# Patient Record
Sex: Male | Born: 1960 | Race: White | Hispanic: No | Marital: Married | Smoking: Never smoker
Health system: Southern US, Community
[De-identification: ages and names within clinical notes are randomized; demographics above are authoritative.]

## PROBLEM LIST (undated history)

## (undated) DIAGNOSIS — H18511 Endothelial corneal dystrophy, right eye: Secondary | ICD-10-CM

## (undated) DIAGNOSIS — N486 Induration penis plastica: Secondary | ICD-10-CM

## (undated) DIAGNOSIS — R918 Other nonspecific abnormal finding of lung field: Secondary | ICD-10-CM

## (undated) DIAGNOSIS — I700 Atherosclerosis of aorta: Secondary | ICD-10-CM

## (undated) DIAGNOSIS — I77819 Aortic ectasia, unspecified site: Secondary | ICD-10-CM

## (undated) DIAGNOSIS — F419 Anxiety disorder, unspecified: Secondary | ICD-10-CM

## (undated) DIAGNOSIS — M1990 Unspecified osteoarthritis, unspecified site: Secondary | ICD-10-CM

## (undated) DIAGNOSIS — J432 Centrilobular emphysema: Secondary | ICD-10-CM

## (undated) HISTORY — PX: WISDOM TOOTH EXTRACTION: SHX21

---

## 2009-11-29 ENCOUNTER — Emergency Department (HOSPITAL_BASED_OUTPATIENT_CLINIC_OR_DEPARTMENT_OTHER): Admission: EM | Admit: 2009-11-29 | Discharge: 2009-11-29 | Payer: Self-pay | Admitting: Emergency Medicine

## 2010-05-27 LAB — POCT CARDIAC MARKERS
CKMB, poc: 1.1 ng/mL (ref 1.0–8.0)
Myoglobin, poc: 55.5 ng/mL (ref 12–200)
Troponin i, poc: <0.05 ng/mL (ref 0.00–0.09)

## 2010-05-27 LAB — COMPREHENSIVE METABOLIC PANEL
ALT: 27 U/L (ref 0–53)
Calcium: 9.6 mg/dL (ref 8.4–10.5)
Creatinine, Ser: 0.8 mg/dL (ref 0.4–1.5)
GFR calc Af Amer: >60 mL/min (ref >60)
Glucose, Bld: 120 mg/dL — ABNORMAL HIGH (ref 70–99)
Sodium: 142 mEq/L (ref 135–145)
Total Protein: 7.3 g/dL (ref 6.0–8.3)

## 2010-05-27 LAB — CBC
HCT: 47.3 % (ref 39.0–52.0)
MCHC: 35.4 g/dL (ref 30.0–36.0)
RDW: 11.6 % (ref 11.5–15.5)

## 2010-05-27 LAB — URINALYSIS, ROUTINE W REFLEX MICROSCOPIC
Bilirubin Urine: NEGATIVE
Ketones, ur: NEGATIVE mg/dL
Nitrite: NEGATIVE
Protein, ur: NEGATIVE mg/dL
Urobilinogen, UA: 0.2 mg/dL (ref 0.0–1.0)
pH: 7 (ref 5.0–8.0)

## 2010-05-27 LAB — DIFFERENTIAL
Eosinophils Absolute: 0.1 10*3/uL (ref 0.0–0.7)
Lymphocytes Relative: 8 % — ABNORMAL LOW (ref 12–46)
Lymphs Abs: 0.7 10*3/uL (ref 0.7–4.0)
Monocytes Relative: 5 % (ref 3–12)
Neutrophils Relative %: 87 % — ABNORMAL HIGH (ref 43–77)

## 2017-02-07 ENCOUNTER — Other Ambulatory Visit: Payer: Self-pay | Admitting: Orthopaedic Surgery

## 2017-02-07 DIAGNOSIS — M25512 Pain in left shoulder: Secondary | ICD-10-CM

## 2017-02-15 ENCOUNTER — Ambulatory Visit
Admission: RE | Admit: 2017-02-15 | Discharge: 2017-02-15 | Disposition: A | Discharge status: Home / Self Care | Payer: No Typology Code available for payment source | Source: Ambulatory Visit | Attending: Orthopaedic Surgery | Admitting: Orthopaedic Surgery

## 2017-02-15 DIAGNOSIS — M25512 Pain in left shoulder: Secondary | ICD-10-CM

## 2017-02-24 ENCOUNTER — Encounter (HOSPITAL_BASED_OUTPATIENT_CLINIC_OR_DEPARTMENT_OTHER): Payer: Self-pay | Admitting: *Deleted

## 2017-02-28 NOTE — H&P (Signed)
PREOPERATIVE H&P  Chief Complaint: INPINGEMENT SYNDROME, STRAIN OF MUSCLE AND TENDON OF LEFT ROATOR CUFF OF LEFT SHOULDER  HPI: Normajean GlasgowSteven Hershman is a 56 y.o. male who presents for preoperative history and physical with a diagnosis of INPINGEMENT SYNDROME, STRAIN OF MUSCLE AND TENDON OF LEFT ROATOR CUFF OF LEFT SHOULDER. Symptoms are rated as moderate to severe, and have been worsening.  This is significantly impairing activities of daily living.  He has elected for surgical management.   Past Medical History:  Diagnosis Date  . Arthritis    Past Surgical History:  Procedure Laterality Date  . WISDOM TOOTH EXTRACTION     Social History   Socioeconomic History  . Marital status: Married    Spouse name: None  . Number of children: None  . Years of education: None  . Highest education level: None  Social Needs  . Financial resource strain: None  . Food insecurity - worry: None  . Food insecurity - inability: None  . Transportation needs - medical: None  . Transportation needs - non-medical: None  Occupational History  . None  Tobacco Use  . Smoking status: Never Smoker  . Smokeless tobacco: Never Used  Substance and Sexual Activity  . Alcohol use: No    Frequency: Never  . Drug use: No  . Sexual activity: None  Other Topics Concern  . None  Social History Narrative  . None   History reviewed. No pertinent family history. No Known Allergies Prior to Admission medications   Medication Sig Start Date End Date Taking? Authorizing Provider  meloxicam (MOBIC) 15 MG tablet Take 15 mg by mouth daily.   Yes [provider]  Omega-3 Fatty Acids (FISH OIL) 1000 MG CAPS Take by mouth.   Yes [provider]     Positive ROS: All other systems have been reviewed and were otherwise negative with the exception of those mentioned in the HPI and as above.  Physical Exam: General: Alert, no acute distress Cardiovascular: No pedal edema Respiratory: No cyanosis, no  use of accessory musculature GI: No organomegaly, abdomen is soft and non-tender Skin: No lesions in the area of chief complaint Neurologic: Sensation intact distally Psychiatric: Patient is competent for consent with normal mood and affect Lymphatic: No axillary or cervical lymphadenopathy  MUSCULOSKELETAL:L shoulder: positive drop arm test, 3/5 cuff strength, distal motor and sensory preserved Assessment: INPINGEMENT SYNDROME, STRAIN OF MUSCLE AND TENDON OF LEFT ROATOR CUFF OF LEFT SHOULDER  Plan: Plan for Procedure(s): IRRIGATION AND DEBRIDEMENT SCOPE LEFT SHOULDER LEFT SHOULDER ACROMIOPLASTY LEFT SHOULDER ACROMIOPLASTY WITH ROTATOR CUFF REPAIR AND OPEN BICEPS TENODESIS, SUBSCAPULARIS  The risks benefits and alternatives were discussed with the patient including but not limited to the risks of nonoperative treatment, versus surgical intervention including infection, bleeding, nerve injury,  blood clots, cardiopulmonary complications, morbidity, mortality, among others, and they were willing to proceed.   Bjorn Pippinax T Joncarlo Friberg, MD  02/28/2017 12:21 PM

## 2017-03-02 ENCOUNTER — Other Ambulatory Visit: Payer: Self-pay

## 2017-03-02 ENCOUNTER — Encounter (HOSPITAL_BASED_OUTPATIENT_CLINIC_OR_DEPARTMENT_OTHER): Payer: Self-pay

## 2017-03-02 ENCOUNTER — Ambulatory Visit (HOSPITAL_BASED_OUTPATIENT_CLINIC_OR_DEPARTMENT_OTHER): Payer: Self-pay | Admitting: Certified Registered"

## 2017-03-02 ENCOUNTER — Ambulatory Visit (HOSPITAL_BASED_OUTPATIENT_CLINIC_OR_DEPARTMENT_OTHER)
Admission: RE | Admit: 2017-03-02 | Discharge: 2017-03-02 | Disposition: A | Discharge status: Home / Self Care | Payer: Self-pay | Source: Ambulatory Visit | Attending: Orthopaedic Surgery | Admitting: Orthopaedic Surgery

## 2017-03-02 ENCOUNTER — Encounter (HOSPITAL_BASED_OUTPATIENT_CLINIC_OR_DEPARTMENT_OTHER)
Admission: RE | Disposition: A | Discharge status: Home / Self Care | Payer: Self-pay | Source: Ambulatory Visit | Attending: Orthopaedic Surgery

## 2017-03-02 DIAGNOSIS — M25312 Other instability, left shoulder: Secondary | ICD-10-CM | POA: Insufficient documentation

## 2017-03-02 DIAGNOSIS — S46012A Strain of muscle(s) and tendon(s) of the rotator cuff of left shoulder, initial encounter: Secondary | ICD-10-CM | POA: Insufficient documentation

## 2017-03-02 DIAGNOSIS — W19XXXA Unspecified fall, initial encounter: Secondary | ICD-10-CM | POA: Insufficient documentation

## 2017-03-02 DIAGNOSIS — Y929 Unspecified place or not applicable: Secondary | ICD-10-CM | POA: Insufficient documentation

## 2017-03-02 DIAGNOSIS — M7542 Impingement syndrome of left shoulder: Secondary | ICD-10-CM | POA: Insufficient documentation

## 2017-03-02 HISTORY — PX: IRRIGATION AND DEBRIDEMENT SHOULDER: SHX5880

## 2017-03-02 HISTORY — DX: Unspecified osteoarthritis, unspecified site: M19.90

## 2017-03-02 HISTORY — PX: SHOULDER ACROMIOPLASTY: SHX6093

## 2017-03-02 HISTORY — PX: SHOULDER ARTHROSCOPY WITH SUBACROMIAL DECOMPRESSION: SHX5684

## 2017-03-02 SURGERY — IRRIGATION AND DEBRIDEMENT SHOULDER
Anesthesia: Regional | Site: Shoulder | Laterality: Left

## 2017-03-02 MED ORDER — FENTANYL CITRATE (PF) 100 MCG/2ML IJ SOLN
INTRAMUSCULAR | Status: AC
  Filled 2017-03-02: qty 2

## 2017-03-02 MED ORDER — PHENYLEPHRINE 40 MCG/ML (10ML) SYRINGE FOR IV PUSH (FOR BLOOD PRESSURE SUPPORT)
PREFILLED_SYRINGE | INTRAVENOUS | Status: AC
  Filled 2017-03-02: qty 10

## 2017-03-02 MED ORDER — ROCURONIUM BROMIDE 10 MG/ML (PF) SYRINGE
PREFILLED_SYRINGE | INTRAVENOUS | Status: AC
  Filled 2017-03-02: qty 5

## 2017-03-02 MED ORDER — LIDOCAINE 2% (20 MG/ML) 5 ML SYRINGE
INTRAMUSCULAR | Status: DC | PRN
  Administered 2017-03-02: 60 mg via INTRAVENOUS

## 2017-03-02 MED ORDER — MELOXICAM 7.5 MG PO TABS: 7.5000 mg | ORAL_TABLET | Freq: Every day | ORAL | 2 refills | Status: AC

## 2017-03-02 MED ORDER — MIDAZOLAM HCL 2 MG/2ML IJ SOLN
INTRAMUSCULAR | Status: DC | PRN
  Administered 2017-03-02: 1 mg via INTRAVENOUS

## 2017-03-02 MED ORDER — LIDOCAINE 2% (20 MG/ML) 5 ML SYRINGE
INTRAMUSCULAR | Status: AC
  Filled 2017-03-02: qty 10

## 2017-03-02 MED ORDER — ONDANSETRON HCL 4 MG/2ML IJ SOLN: 4.0000 mg | Freq: Once | INTRAMUSCULAR | Status: DC | PRN

## 2017-03-02 MED ORDER — ROCURONIUM BROMIDE 10 MG/ML (PF) SYRINGE
PREFILLED_SYRINGE | INTRAVENOUS | Status: DC | PRN
  Administered 2017-03-02: 50 mg via INTRAVENOUS

## 2017-03-02 MED ORDER — MIDAZOLAM HCL 2 MG/2ML IJ SOLN
1.0000 mg | INTRAMUSCULAR | Status: DC | PRN
  Administered 2017-03-02: 2 mg via INTRAVENOUS

## 2017-03-02 MED ORDER — ROPIVACAINE HCL 7.5 MG/ML IJ SOLN
INTRAMUSCULAR | Status: DC | PRN
  Administered 2017-03-02: 20 mL via PERINEURAL

## 2017-03-02 MED ORDER — VANCOMYCIN HCL 1000 MG IV SOLR
INTRAVENOUS | Status: AC
  Filled 2017-03-02: qty 1000

## 2017-03-02 MED ORDER — FENTANYL CITRATE (PF) 100 MCG/2ML IJ SOLN: 50.0000 ug | INTRAMUSCULAR | Status: DC | PRN

## 2017-03-02 MED ORDER — ONDANSETRON HCL 4 MG PO TABS: 4.0000 mg | ORAL_TABLET | Freq: Three times a day (TID) | ORAL | 0 refills | Status: DC | PRN

## 2017-03-02 MED ORDER — DEXAMETHASONE SODIUM PHOSPHATE 10 MG/ML IJ SOLN
INTRAMUSCULAR | Status: AC
  Filled 2017-03-02: qty 2

## 2017-03-02 MED ORDER — ACETAMINOPHEN 500 MG PO TABS: ORAL_TABLET | ORAL | 2 refills | Status: DC

## 2017-03-02 MED ORDER — LACTATED RINGERS IV SOLN
INTRAVENOUS | Status: DC
  Administered 2017-03-02 (×3): via INTRAVENOUS

## 2017-03-02 MED ORDER — PROPOFOL 10 MG/ML IV BOLUS
INTRAVENOUS | Status: DC | PRN
  Administered 2017-03-02: 150 mg via INTRAVENOUS
  Administered 2017-03-02: 50 mg via INTRAVENOUS

## 2017-03-02 MED ORDER — SODIUM CHLORIDE 0.9 % IR SOLN
Status: DC | PRN
  Administered 2017-03-02: 6000 mL

## 2017-03-02 MED ORDER — DEXAMETHASONE SODIUM PHOSPHATE 10 MG/ML IJ SOLN
INTRAMUSCULAR | Status: DC | PRN
  Administered 2017-03-02: 10 mg via INTRAVENOUS

## 2017-03-02 MED ORDER — SUGAMMADEX SODIUM 200 MG/2ML IV SOLN
INTRAVENOUS | Status: DC | PRN
  Administered 2017-03-02: 300 mg via INTRAVENOUS

## 2017-03-02 MED ORDER — FENTANYL CITRATE (PF) 100 MCG/2ML IJ SOLN: 25.0000 ug | INTRAMUSCULAR | Status: DC | PRN

## 2017-03-02 MED ORDER — CHLORHEXIDINE GLUCONATE 4 % EX LIQD: 60.0000 mL | Freq: Once | CUTANEOUS | Status: DC

## 2017-03-02 MED ORDER — CEFAZOLIN SODIUM-DEXTROSE 2-4 GM/100ML-% IV SOLN
INTRAVENOUS | Status: AC
  Filled 2017-03-02: qty 100

## 2017-03-02 MED ORDER — ONDANSETRON HCL 4 MG/2ML IJ SOLN
INTRAMUSCULAR | Status: AC
  Filled 2017-03-02: qty 4

## 2017-03-02 MED ORDER — CEFAZOLIN SODIUM-DEXTROSE 2-4 GM/100ML-% IV SOLN
2.0000 g | INTRAVENOUS | Status: AC
  Administered 2017-03-02: 2 g via INTRAVENOUS

## 2017-03-02 MED ORDER — FENTANYL CITRATE (PF) 100 MCG/2ML IJ SOLN
INTRAMUSCULAR | Status: DC | PRN
  Administered 2017-03-02 (×2): 50 ug via INTRAVENOUS

## 2017-03-02 MED ORDER — PHENYLEPHRINE HCL 10 MG/ML IJ SOLN
INTRAVENOUS | Status: DC | PRN
  Administered 2017-03-02: 25 ug/min via INTRAVENOUS

## 2017-03-02 MED ORDER — MIDAZOLAM HCL 2 MG/2ML IJ SOLN
INTRAMUSCULAR | Status: AC
  Filled 2017-03-02: qty 2

## 2017-03-02 MED ORDER — ONDANSETRON HCL 4 MG/2ML IJ SOLN
INTRAMUSCULAR | Status: DC | PRN
  Administered 2017-03-02: 4 mg via INTRAVENOUS

## 2017-03-02 MED ORDER — SCOPOLAMINE 1 MG/3DAYS TD PT72: 1.0000 | MEDICATED_PATCH | Freq: Once | TRANSDERMAL | Status: DC | PRN

## 2017-03-02 MED ORDER — PROPOFOL 10 MG/ML IV BOLUS
INTRAVENOUS | Status: AC
  Filled 2017-03-02: qty 20

## 2017-03-02 MED ORDER — OXYCODONE HCL 5 MG PO TABS: ORAL_TABLET | ORAL | 0 refills | Status: DC

## 2017-03-02 SURGICAL SUPPLY — 70 items
BLADE SHAVER BONE 5.0X13 (MISCELLANEOUS) IMPLANT
BLADE SURG 10 STRL SS (BLADE) ×4 IMPLANT
BNDG COHESIVE 4X5 TAN STRL (GAUZE/BANDAGES/DRESSINGS) IMPLANT
BURR OVAL 8 FLU 4.0MM X 13CM (MISCELLANEOUS) ×1
BURR OVAL 8 FLU 4.0X13 (MISCELLANEOUS) ×3 IMPLANT
CANNULA 5.75X71 LONG (CANNULA) ×4 IMPLANT
CANNULA PASSPORT BUTTON 10-40 (CANNULA) IMPLANT
CANNULA TWIST IN 8.25X7CM (CANNULA) IMPLANT
CHLORAPREP W/TINT 26ML (MISCELLANEOUS) ×4 IMPLANT
CLOSURE WOUND 1/2 X4 (GAUZE/BANDAGES/DRESSINGS)
DECANTER SPIKE VIAL GLASS SM (MISCELLANEOUS) IMPLANT
DISSECTOR 3.5MM X 13CM CVD (MISCELLANEOUS) IMPLANT
DISSECTOR 4.0MMX13CM CVD (MISCELLANEOUS) IMPLANT
DRAPE IMP U-DRAPE 54X76 (DRAPES) ×4 IMPLANT
DRAPE INCISE IOBAN 66X45 STRL (DRAPES) IMPLANT
DRAPE STERI 35X30 U-POUCH (DRAPES) ×4 IMPLANT
DRAPE U-SHAPE 76X120 STRL (DRAPES) ×4 IMPLANT
DRSG PAD ABDOMINAL 8X10 ST (GAUZE/BANDAGES/DRESSINGS) ×4 IMPLANT
ELECT NEEDLE TIP 2.8 STRL (NEEDLE) IMPLANT
ELECT REM PT RETURN 9FT ADLT (ELECTROSURGICAL) ×4
ELECTRODE REM PT RTRN 9FT ADLT (ELECTROSURGICAL) ×2 IMPLANT
GAUZE SPONGE 4X4 12PLY STRL (GAUZE/BANDAGES/DRESSINGS) ×4 IMPLANT
GAUZE XEROFORM 1X8 LF (GAUZE/BANDAGES/DRESSINGS) IMPLANT
GLOVE BIOGEL PI IND STRL 7.0 (GLOVE) ×2 IMPLANT
GLOVE BIOGEL PI IND STRL 8 (GLOVE) ×4 IMPLANT
GLOVE BIOGEL PI INDICATOR 7.0 (GLOVE) ×2
GLOVE BIOGEL PI INDICATOR 8 (GLOVE) ×4
GLOVE ECLIPSE 6.5 STRL STRAW (GLOVE) ×8 IMPLANT
GLOVE ECLIPSE 7.0 STRL STRAW (GLOVE) ×4 IMPLANT
GLOVE ECLIPSE 8.0 STRL XLNG CF (GLOVE) ×4 IMPLANT
GOWN STRL REUS W/ TWL LRG LVL3 (GOWN DISPOSABLE) ×4 IMPLANT
GOWN STRL REUS W/TWL LRG LVL3 (GOWN DISPOSABLE) ×4
GOWN STRL REUS W/TWL XL LVL3 (GOWN DISPOSABLE) ×4 IMPLANT
KIT PUSHLOCK 2.9 HIP (KITS) IMPLANT
KIT STABILIZATION SHOULDER (MISCELLANEOUS) ×4 IMPLANT
LASSO 90 CVE QUICKPAS (DISPOSABLE) ×4 IMPLANT
MANIFOLD NEPTUNE II (INSTRUMENTS) ×4 IMPLANT
NDL SUT 6 .5 CRC .975X.05 MAYO (NEEDLE) IMPLANT
NEEDLE MAYO TAPER (NEEDLE)
NEEDLE SCORPION MULTI FIRE (NEEDLE) ×8 IMPLANT
PACK ARTHROSCOPY DSU (CUSTOM PROCEDURE TRAY) ×4 IMPLANT
PACK BASIN DAY SURGERY FS (CUSTOM PROCEDURE TRAY) ×4 IMPLANT
PENCIL BUTTON HOLSTER BLD 10FT (ELECTRODE) IMPLANT
PROBE BIPOLAR ATHRO 135MM 90D (MISCELLANEOUS) ×12 IMPLANT
RESTRAINT HEAD UNIVERSAL NS (MISCELLANEOUS) ×4 IMPLANT
SHEET MEDIUM DRAPE 40X70 STRL (DRAPES) IMPLANT
SLEEVE SCD COMPRESS KNEE MED (MISCELLANEOUS) ×4 IMPLANT
SLING ARM FOAM STRAP LRG (SOFTGOODS) IMPLANT
SLING ARM IMMOBILIZER LRG (SOFTGOODS) IMPLANT
SLING ARM IMMOBILIZER MED (SOFTGOODS) IMPLANT
SLING ARM MED ADULT FOAM STRAP (SOFTGOODS) IMPLANT
SLING ARM XL FOAM STRAP (SOFTGOODS) IMPLANT
SPONGE LAP 4X18 X RAY DECT (DISPOSABLE) IMPLANT
STRIP CLOSURE SKIN 1/2X4 (GAUZE/BANDAGES/DRESSINGS) IMPLANT
SUCTION FRAZIER HANDLE 10FR (MISCELLANEOUS)
SUCTION TUBE FRAZIER 10FR DISP (MISCELLANEOUS) IMPLANT
SUT ETHILON 3 0 PS 1 (SUTURE) IMPLANT
SUT FIBERWIRE #2 38 T-5 BLUE (SUTURE)
SUT MON AB 2-0 CT1 36 (SUTURE) ×4 IMPLANT
SUT TIGER TAPE 7 IN WHITE (SUTURE) IMPLANT
SUTURE FIBERWR #2 38 T-5 BLUE (SUTURE) IMPLANT
SUTURE TAPE TIGERLINK 1.3MM BL (SUTURE) IMPLANT
SUTURETAPE TIGERLINK 1.3MM BL (SUTURE)
TAPE FIBER 2MM 7IN #2 BLUE (SUTURE) IMPLANT
TOWEL OR 17X24 6PK STRL BLUE (TOWEL DISPOSABLE) ×4 IMPLANT
TOWEL OR NON WOVEN STRL DISP B (DISPOSABLE) ×4 IMPLANT
TUBE CONNECTING 20'X1/4 (TUBING) ×1
TUBE CONNECTING 20X1/4 (TUBING) ×3 IMPLANT
TUBING ARTHROSCOPY IRRIG 16FT (MISCELLANEOUS) ×4 IMPLANT
YANKAUER SUCT BULB TIP NO VENT (SUCTIONS) IMPLANT

## 2017-03-02 NOTE — Anesthesia Preprocedure Evaluation (Addendum)
Anesthesia Evaluation  Patient identified by MRN, date of birth, ID band Patient awake    Reviewed: Allergy & Precautions, NPO status , Patient's Chart, lab work & pertinent test results  Airway Mallampati: II  TM Distance: >3 FB Neck ROM: Full    Dental  (+) Partial Upper, Poor Dentition, Missing, Chipped, Dental Advisory Given   Pulmonary neg pulmonary ROS,    Pulmonary exam normal breath sounds clear to auscultation       Cardiovascular negative cardio ROS Normal cardiovascular exam Rhythm:Regular Rate:Normal     Neuro/Psych negative neurological ROS  negative psych ROS   GI/Hepatic negative GI ROS, Neg liver ROS,   Endo/Other  negative endocrine ROS  Renal/GU negative Renal ROS     Musculoskeletal INPINGEMENT SYNDROME STRAIN OF MUSCLE AND TENDON OF LEFT ROATOR CUFF OF LEFT SHOULDER   Abdominal   Peds  Hematology negative hematology ROS (+)   Anesthesia Other Findings   Reproductive/Obstetrics                           Anesthesia Physical Anesthesia Plan  ASA: II  Anesthesia Plan: General and Regional   Post-op Pain Management: GA combined w/ Regional for post-op pain   Induction: Intravenous  PONV Risk Score and Plan: 2 and Ondansetron, Dexamethasone and Treatment may vary due to age or medical condition  Airway Management Planned: Oral ETT  Additional Equipment:   Intra-op Plan:   Post-operative Plan: Extubation in OR  Informed Consent: I have reviewed the patients History and Physical, chart, labs and discussed the procedure including the risks, benefits and alternatives for the proposed anesthesia with the patient or authorized representative who has indicated his/her understanding and acceptance.   Dental advisory given  Plan Discussed with: CRNA  Anesthesia Plan Comments:        Anesthesia Quick Evaluation

## 2017-03-02 NOTE — Discharge Instructions (Signed)
Shouder arthroscopy, partial rotator cuff tear debridement subacromial decompression °Care After Instructions °Refer to this sheet in the next few weeks. These discharge instructions provide you with general information on caring for yourself after you leave the hospital. Your caregiver may also give you specific instructions. Your treatment has been planned according to the most current medical practices available, but unavoidable complications sometimes occur. If you have any problems or questions after discharge, please call your caregiver. °HOME INSTRUCTIONS °You may resume a normal diet and activities as directed. Take showers instead of baths until informed otherwise.  °Change bandages (dressings) in 3 days.  Swab wounds daily with betadine.  Wash shoulder with soap and water.  Pat dry.  Cover wounds with bandaids. °Only take over-the-counter or prescription medicines for pain, discomfort, or fever as directed by your caregiver.  °Wear your sling for the next 2 days unless otherwise instructed. °Eat a well-balanced diet.  °Avoid lifting or driving until you are instructed otherwise.  °Make an appointment to see your caregiver for stitches (suture) or staple removal one week after surgery. ° °SEEK MEDICAL CARE IF: °You have swelling of your calf or leg.  °You develop shortness of breath or chest pain.  °You have redness, swelling, or increasing pain in the wound.  °There is pus or any unusual drainage coming from the surgical site.  °You notice a bad smell coming from the surgical site or dressing.  °The surgical site breaks open after sutures or staples have been removed.  °There is persistent bleeding from the suture or staple line.  °You are getting worse or are not improving.  °You have any other questions or concerns.  °SEEK IMMEDIATE MEDICAL CARE IF:  °You have a fever greater than 101 °You develop a rash.  °You have difficulty breathing.  °You develop any reaction or side effects to medicines given.    °Your knee motion is decreasing rather than improving.  °MAKE SURE YOU:  °Understand these instructions.  °Will watch your condition.  °Will get help right away if you are not doing well or get worse.  ° ° ° °Post Anesthesia Home Care Instructions ° °Activity: °Get plenty of rest for the remainder of the day. A responsible individual must stay with you for 24 hours following the procedure.  °For the next 24 hours, DO NOT: °-Drive a car °-Operate machinery °-Drink alcoholic beverages °-Take any medication unless instructed by your physician °-Make any legal decisions or sign important papers. ° °Meals: °Start with liquid foods such as gelatin or soup. Progress to regular foods as tolerated. Avoid greasy, spicy, heavy foods. If nausea and/or vomiting occur, drink only clear liquids until the nausea and/or vomiting subsides. Call your physician if vomiting continues. ° °Special Instructions/Symptoms: °Your throat may feel dry or sore from the anesthesia or the breathing tube placed in your throat during surgery. If this causes discomfort, gargle with warm salt water. The discomfort should disappear within 24 hours. ° °If you had a scopolamine patch placed behind your ear for the management of post- operative nausea and/or vomiting: ° °1. The medication in the patch is effective for 72 hours, after which it should be removed.  Wrap patch in a tissue and discard in the trash. Wash hands thoroughly with soap and water. °2. You may remove the patch earlier than 72 hours if you experience unpleasant side effects which may include dry mouth, dizziness or visual disturbances. °3. Avoid touching the patch. Wash your hands with soap and water after   contact with the patch. °  ° ° °Regional Anesthesia Blocks ° °1. Numbness or the inability to move the "blocked" extremity may last from 3-48 hours after placement. The length of time depends on the medication injected and your individual response to the medication. If the numbness  is not going away after 48 hours, call your surgeon. ° °2. The extremity that is blocked will need to be protected until the numbness is gone and the  Strength has returned. Because you cannot feel it, you will need to take extra care to avoid injury. Because it may be weak, you may have difficulty moving it or using it. You may not know what position it is in without looking at it while the block is in effect. ° °3. For blocks in the legs and feet, returning to weight bearing and walking needs to be done carefully. You will need to wait until the numbness is entirely gone and the strength has returned. You should be able to move your leg and foot normally before you try and bear weight or walk. You will need someone to be with you when you first try to ensure you do not fall and possibly risk injury. ° °4. Bruising and tenderness at the needle site are common side effects and will resolve in a few days. ° °5. Persistent numbness or new problems with movement should be communicated to the surgeon or the [facility] ([phone])/ [facility] ([phone]). °

## 2017-03-02 NOTE — Progress Notes (Signed)
AssistedDr. Ellender with left, ultrasound guided, interscalene  block. Side rails up, monitors on throughout procedure. See vital signs in flow sheet. Tolerated Procedure well.  

## 2017-03-02 NOTE — Anesthesia Procedure Notes (Signed)
Procedure Name: Intubation Date/Time: 03/02/2017 10:54 AM Performed by: Minerva EndsMirarchi, Jabron Weese M, CRNA Pre-anesthesia Checklist: Patient identified, Emergency Drugs available, Suction available and Patient being monitored Patient Re-evaluated:Patient Re-evaluated prior to induction Oxygen Delivery Method: Circle System Utilized Preoxygenation: Pre-oxygenation with 100% oxygen Induction Type: IV induction Ventilation: Mask ventilation without difficulty Laryngoscope Size: Miller and 2 Grade View: Grade I Tube type: Oral Tube size: 7.0 mm Number of attempts: 1 Airway Equipment and Method: Stylet and Oral airway Placement Confirmation: ETT inserted through vocal cords under direct vision,  positive ETCO2 and breath sounds checked- equal and bilateral Secured at: 21 cm Tube secured with: Tape Dental Injury: Teeth and Oropharynx as per pre-operative assessment  Comments: Smooth IV induction Ellender--- intubation AM CRNA atraumatic--- teeth and mouth as preop--- many broken and chipped teeth--- teeth and mouth unchanged after intubation--- bilat BS Ellender

## 2017-03-02 NOTE — Op Note (Signed)
Orthopaedic Surgery Operative Note (CSN: [id number])  Edward GlasgowSteven Horton  11/29/1960 Date of Surgery: 03/02/2017   Diagnoses:  Left shoulder dislocation, massive rotator cuff repair, medialization of biceps, os acromiale  Procedure:  Left shoulder arthroscopy, extensive debridement [id number] Biceps tenotomy Subacromial decompression 29826  Operative Finding Operative plan was for a repair of a massive rotator cuff tear that was thought to be a mildly acute on chronic picture with a retracted tear.  Upon inspection of the joint there is clear sign of chronic acetabular sedation of the acromion as well as severe retraction and atrophy of the muscle of the cuff.  We long discussion with the patient's wife and son on how to proceed with options including extensive debridement, subacromial decompression and biceps tenotomy versus an attempt at a rotator cuff repair..  The family felt that less invasive options and the plan for eventually in a reverse total shoulder arthroplasty if he was not happy was more appropriate as he can get back to work sooner.Marland Kitchen.  Post-operative plan: The patient will be weightbearing as tolerated after 2 days in a sling.  The patient will be discharged home.  DVT prophylaxis not indicated and upper extremity surgery in a healthy patient.  Pain control with PRN pain medication preferring oral medicines.  Follow up plan will be scheduled in approximately 10-14 days for wound check.  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  Subscapularis, infraspinatus, supraspinatus torn.  Biceps medialized. Surgeons:Primary: Bjorn PippinVarkey, Rilie Glanz T, MD Assistants:Lindsey Dub MikesStanbery Location: [facility] OR ROOM 6 Anesthesia: Choice Antibiotics: Ancef 2g preop Tourniquet time: * No tourniquets in log * Estimated Blood Loss: 10mL Complications: None Specimens: None Implants: * No implants in log *  Indications for Surgery:   Edward GlasgowSteven Horton is a 56 y.o.  male with fall resulting in a dislocation of his left shoulder and reported mild pain prior to surgery but no major problems with his shoulder.  We began.  We will plan for a rotator cuff repair.  And biceps tenodesis with the understanding that a massive rotator cuff repair had a chance of failure of 50%.  Benefits and risks of operative and nonoperative management were discussed prior to surgery with patient/guardian(s) and informed consent form was completed.  Specific risks including infection, need for additional surgery, rotator cuff.  Repair failure, stiffness.     Procedure:   The patient was identified in the preoperative holding area where the surgical site was marked. The patient was taken to the OR where a procedural timeout was called and the above noted anesthesia was induced.  The patient was positioned beachchair on an allen table, spider positioner.  Preoperative antibiotics were dosed.  The patient's left shoulder was prepped and draped in the usual sterile fashion.  A second preoperative timeout was called.      Exam under anesthesia: no obvious anterior posterior instability, head seemed to elevate during FE attempts Articular space: Grade 1-2 changes head and glenoid.  Obvious attritional loss of anterior inferior labrum.  Chondral surfaces:Intact, no sign of chondral degeneration on the glenoid or humeral head Biceps: medialized in setting of known subscapularis tear with Grade 2 Slap lesion Subscapularis: near complete tear, some remaining tissue inferiorly. Superior Cuff: retracted non-mobile tears of the supraspinatus and infraspinatus tendons, intact teres minor Bursal side: Os acromiale noted, mobile at baseline, still similarly mobile post SAD  A standard posterior viewing portal was made after localizing the portal with a spinal needle.  An anterior accessory  portal was also made.  After clearing the articular space the camera was positioned in the subacromial space.   Findings above.  We performed an extensive debridement of the articular and bursal surfaces and noted the above findings.  Biceps tenotomy performed.   Subacromial decompression: We made a lateral portal with spinal needle guidance. We then proceeded to debride bursal tissue extensively with a shaver and arthrocare device. At that point we continued to identify the borders of the acromion and identify the spur. We then carefully preserved the deltoid fascia and used a burr to convert the type 2 acromion to a Type 1 flat acromion without issue even in the setting of an os acromial.  After an extensive discussion with the family the decision was made that an attempted rotator cuff repair which would likely fail based on the tissue quality and retraction was determined to not be in the patient's best interest.  We instead elected with a limited goal surgery and preservation of his tissue planes for possibility of an eventual reverse total shoulder arthroplasty.  Portals were closed in a interrupted fashion with observable sutures and sterile dressing placed prior to waking the patient from anesthesia.

## 2017-03-02 NOTE — Interval H&P Note (Signed)
History and Physical Interval Note:  03/02/2017 9:53 AM  Edward Horton  has presented today for surgery, with the diagnosis of INPINGEMENT SYNDROME, STRAIN OF MUSCLE AND TENDON OF LEFT ROATOR CUFF OF LEFT SHOULDER  The various methods of treatment have been discussed with the patient and family. After consideration of risks, benefits and other options for treatment, the patient has consented to  Procedure(s): IRRIGATION AND DEBRIDEMENT SCOPE LEFT SHOULDER (Left) LEFT SHOULDER ACROMIOPLASTY (Left) LEFT SHOULDER ACROMIOPLASTY WITH ROTATOR CUFF REPAIR AND OPEN BICEPS TENODESIS, SUBSCAPULARIS (Left) as a surgical intervention .  The patient's history has been reviewed, patient examined, no change in status, stable for surgery.  I have reviewed the patient's chart and labs.  Questions were answered to the patient's satisfaction.     Bjorn Pippinax T Devin Foskey

## 2017-03-02 NOTE — Anesthesia Procedure Notes (Signed)
Anesthesia Regional Block: Interscalene brachial plexus block   Pre-Anesthetic Checklist: ,, timeout performed, Correct Patient, Correct Site, Correct Laterality, Correct Procedure,, site marked, risks and benefits discussed, Surgical consent,  Pre-op evaluation,  At surgeon's request and post-op pain management  Laterality: Left  Prep: chloraprep       Needles:  Injection technique: Single-shot  Needle Type: Echogenic Stimulator Needle     Needle Length: 9cm  Needle Gauge: 21     Additional Needles:   Procedures:,,,, ultrasound used (permanent image in chart),,,,  Narrative:  Start time: 03/02/2017 9:30 AM End time: 03/02/2017 9:40 AM Injection made incrementally with aspirations every 5 mL.  Performed by: Personally  Anesthesiologist: Leonides GrillsEllender, Ryan P, MD  Additional Notes: Functioning IV was confirmed and monitors were applied.  A 90mm 21ga Arrow echogenic stimulator needle was used. Sterile prep, hand hygiene and sterile gloves were used.  Negative aspiration and negative test dose prior to incremental administration of local anesthetic. The patient tolerated the procedure well.

## 2017-03-02 NOTE — Anesthesia Procedure Notes (Signed)
Date/Time: 03/02/2017 12:23 PM Performed by: Minerva EndsMirarchi, Aylin Rhoads M, CRNA Oxygen Delivery Method: Simple face mask Placement Confirmation: breath sounds checked- equal and bilateral and positive ETCO2 Dental Injury: Teeth and Oropharynx as per pre-operative assessment  Comments: Extubated to face mask

## 2017-03-02 NOTE — Transfer of Care (Signed)
Immediate Anesthesia Transfer of Care Note  Patient: Edward Horton  Procedure(s) Performed: IRRIGATION AND DEBRIDEMENT SCOPE LEFT SHOULDER (Left ) LEFT SHOULDER ACROMIOPLASTY (Left Shoulder) SHOULDER ARTHROSCOPY WITH SUBACROMIAL DECOMPRESSION (Left Shoulder)  Patient Location: PACU  Anesthesia Type:General  Level of Consciousness: sedated  Airway & Oxygen Therapy: Patient Spontanous Breathing and Patient connected to face mask oxygen  Post-op Assessment: Report given to RN and Post -op Vital signs reviewed and stable  Post vital signs: Reviewed and stable  Last Vitals:  Vitals:   03/02/17 1000 03/02/17 1239  BP: (!) 134/92 132/88  Pulse: 89 93  Resp: 18 18  Temp:  (P) 36.4 C  SpO2: 95% 98%    Last Pain:  Vitals:   03/02/17 0841  TempSrc: Oral  PainSc: 1       Patients Stated Pain Goal: 1 (03/02/17 0841)  Complications: No apparent anesthesia complications

## 2017-03-03 ENCOUNTER — Encounter (HOSPITAL_BASED_OUTPATIENT_CLINIC_OR_DEPARTMENT_OTHER): Payer: Self-pay | Admitting: Orthopaedic Surgery

## 2017-03-03 NOTE — Anesthesia Postprocedure Evaluation (Signed)
Anesthesia Post Note  Patient: Normajean GlasgowSteven Christoffel  Procedure(s) Performed: IRRIGATION AND DEBRIDEMENT SCOPE LEFT SHOULDER (Left ) LEFT SHOULDER ACROMIOPLASTY (Left Shoulder) SHOULDER ARTHROSCOPY WITH SUBACROMIAL DECOMPRESSION (Left Shoulder)     Patient location during evaluation: PACU Anesthesia Type: Regional and General Level of consciousness: awake and alert Pain management: pain level controlled Vital Signs Assessment: post-procedure vital signs reviewed and stable Respiratory status: spontaneous breathing, nonlabored ventilation, respiratory function stable and patient connected to nasal cannula oxygen Cardiovascular status: blood pressure returned to baseline and stable Postop Assessment: no apparent nausea or vomiting Anesthetic complications: no    Last Vitals:  Vitals:   03/02/17 1330 03/02/17 1405  BP: 128/82 138/87  Pulse: 86 88  Resp: 17 18  Temp:  36.8 C  SpO2: 94% 95%    Last Pain:  Vitals:   03/03/17 0934  TempSrc:   PainSc: 3                  Britzy Graul P Abdulkadir Emmanuel

## 2017-04-11 NOTE — H&P (Signed)
PREOPERATIVE H&P  Chief Complaint: LEFT SHOULDER OSTEOARTRITIS  HPI: Edward GlasgowSteven Hofferber is a 57 y.o. male who presents for preoperative history and physical with a diagnosis of LEFT SHOULDER OSTEOARTRITIS. Symptoms are rated as moderate to severe, and have been worsening.  This is significantly impairing activities of daily living.  He has elected for surgical management.   Past Medical History:  Diagnosis Date  . Arthritis    Past Surgical History:  Procedure Laterality Date  . IRRIGATION AND DEBRIDEMENT SHOULDER Left 03/02/2017   Procedure: IRRIGATION AND DEBRIDEMENT SCOPE LEFT SHOULDER;  Surgeon: Bjorn PippinVarkey, Arietta Eisenstein T, MD;  Location: [facility];  Service: Orthopedics;  Laterality: Left;  . SHOULDER ACROMIOPLASTY Left 03/02/2017   Procedure: LEFT SHOULDER ACROMIOPLASTY;  Surgeon: Bjorn PippinVarkey, Milanni Ayub T, MD;  Location: [facility];  Service: Orthopedics;  Laterality: Left;  . SHOULDER ARTHROSCOPY WITH SUBACROMIAL DECOMPRESSION Left 03/02/2017   Procedure: SHOULDER ARTHROSCOPY WITH SUBACROMIAL DECOMPRESSION;  Surgeon: Bjorn PippinVarkey, Khasir Woodrome T, MD;  Location:  SURGERY CENTER;  Service: Orthopedics;  Laterality: Left;  . WISDOM TOOTH EXTRACTION     Social History   Socioeconomic History  . Marital status: Married    Spouse name: Not on file  . Number of children: Not on file  . Years of education: Not on file  . Highest education level: Not on file  Social Needs  . Financial resource strain: Not on file  . Food insecurity - worry: Not on file  . Food insecurity - inability: Not on file  . Transportation needs - medical: Not on file  . Transportation needs - non-medical: Not on file  Occupational History  . Not on file  Tobacco Use  . Smoking status: Never Smoker  . Smokeless tobacco: Never Used  Substance and Sexual Activity  . Alcohol use: No    Frequency: Never  . Drug use: No  . Sexual activity: Not on file  Other Topics Concern  . Not on file  Social History  Narrative  . Not on file   No family history on file. No Known Allergies Prior to Admission medications   Medication Sig Start Date End Date Taking? Authorizing Provider  acetaminophen (TYLENOL) 500 MG tablet Take 2 tabs po q8 hours x 14 days following surgery 03/02/17   Cristie HemStanbery, Mary L, PA-C  ondansetron (ZOFRAN) 4 MG tablet Take 1 tablet (4 mg total) by mouth every 8 (eight) hours as needed for nausea or vomiting. 03/02/17   Cristie HemStanbery, Mary L, PA-C  oxyCODONE (ROXICODONE) 5 MG immediate release tablet Take 1-2 tabs po q6-8 hours prn pain.  Do not exceed 6 tabs per day 03/02/17   Cristie HemStanbery, Mary L, PA-C     Positive ROS: All other systems have been reviewed and were otherwise negative with the exception of those mentioned in the HPI and as above.  Physical Exam: General: Alert, no acute distress Cardiovascular: No pedal edema Respiratory: No cyanosis, no use of accessory musculature GI: No organomegaly, abdomen is soft and non-tender Skin: No lesions in the area of chief complaint Neurologic: Sensation intact distally Psychiatric: Patient is competent for consent with normal mood and affect Lymphatic: No axillary or cervical lymphadenopathy  MUSCULOSKELETAL: L shoulder: AFE 30, passive 160, cuff 3/5  Assessment: LEFT SHOULDER OSTEOARTRITIS  Plan: Plan for Procedure(s): LEFT REVERSE  TOTAL SHOULDER ARTHROPLASTY  The risks benefits and alternatives were discussed with the patient including but not limited to the risks of nonoperative treatment, versus surgical intervention including infection, bleeding, nerve injury,  blood clots, cardiopulmonary complications, morbidity, mortality, among others, and they were willing to proceed.   Bjorn Pippin, MD  04/11/2017 10:16 AM

## 2017-04-13 ENCOUNTER — Encounter (HOSPITAL_BASED_OUTPATIENT_CLINIC_OR_DEPARTMENT_OTHER): Payer: Self-pay | Admitting: *Deleted

## 2017-04-14 ENCOUNTER — Encounter (HOSPITAL_BASED_OUTPATIENT_CLINIC_OR_DEPARTMENT_OTHER)
Admission: RE | Admit: 2017-04-14 | Discharge: 2017-04-14 | Disposition: A | Discharge status: Home / Self Care | Payer: Self-pay | Source: Ambulatory Visit | Attending: Orthopaedic Surgery | Admitting: Orthopaedic Surgery

## 2017-04-14 DIAGNOSIS — Z01812 Encounter for preprocedural laboratory examination: Secondary | ICD-10-CM | POA: Insufficient documentation

## 2017-04-14 LAB — SURGICAL PCR SCREEN
MRSA, PCR: NEGATIVE
STAPHYLOCOCCUS AUREUS: NEGATIVE

## 2017-05-04 ENCOUNTER — Ambulatory Visit (HOSPITAL_BASED_OUTPATIENT_CLINIC_OR_DEPARTMENT_OTHER): Payer: Self-pay | Admitting: Anesthesiology

## 2017-05-04 ENCOUNTER — Encounter (HOSPITAL_BASED_OUTPATIENT_CLINIC_OR_DEPARTMENT_OTHER): Payer: Self-pay | Admitting: Anesthesiology

## 2017-05-04 ENCOUNTER — Other Ambulatory Visit: Payer: Self-pay

## 2017-05-04 ENCOUNTER — Ambulatory Visit (HOSPITAL_COMMUNITY): Payer: Self-pay

## 2017-05-04 ENCOUNTER — Ambulatory Visit (HOSPITAL_BASED_OUTPATIENT_CLINIC_OR_DEPARTMENT_OTHER)
Admission: RE | Admit: 2017-05-04 | Discharge: 2017-05-04 | Disposition: A | Discharge status: Home / Self Care | Payer: Self-pay | Source: Ambulatory Visit | Attending: Orthopaedic Surgery | Admitting: Orthopaedic Surgery

## 2017-05-04 ENCOUNTER — Encounter (HOSPITAL_BASED_OUTPATIENT_CLINIC_OR_DEPARTMENT_OTHER)
Admission: RE | Disposition: A | Discharge status: Home / Self Care | Payer: Self-pay | Source: Ambulatory Visit | Attending: Orthopaedic Surgery

## 2017-05-04 DIAGNOSIS — M19012 Primary osteoarthritis, left shoulder: Secondary | ICD-10-CM | POA: Insufficient documentation

## 2017-05-04 DIAGNOSIS — Z09 Encounter for follow-up examination after completed treatment for conditions other than malignant neoplasm: Secondary | ICD-10-CM

## 2017-05-04 DIAGNOSIS — M75102 Unspecified rotator cuff tear or rupture of left shoulder, not specified as traumatic: Secondary | ICD-10-CM | POA: Insufficient documentation

## 2017-05-04 DIAGNOSIS — Z79899 Other long term (current) drug therapy: Secondary | ICD-10-CM | POA: Insufficient documentation

## 2017-05-04 HISTORY — PX: REVERSE SHOULDER ARTHROPLASTY: SHX5054

## 2017-05-04 SURGERY — ARTHROPLASTY, SHOULDER, TOTAL, REVERSE
Anesthesia: General | Site: Shoulder | Laterality: Left

## 2017-05-04 MED ORDER — CEFAZOLIN SODIUM-DEXTROSE 1-4 GM/50ML-% IV SOLN
1.0000 g | Freq: Once | INTRAVENOUS | Status: AC
  Administered 2017-05-04: 1 g via INTRAVENOUS

## 2017-05-04 MED ORDER — OXYCODONE HCL 5 MG PO TABS
5.0000 mg | ORAL_TABLET | Freq: Once | ORAL | Status: AC | PRN
  Administered 2017-05-04: 5 mg via ORAL

## 2017-05-04 MED ORDER — BUPIVACAINE HCL (PF) 0.25 % IJ SOLN
INTRAMUSCULAR | Status: AC
  Filled 2017-05-04: qty 30

## 2017-05-04 MED ORDER — LIDOCAINE 2% (20 MG/ML) 5 ML SYRINGE
INTRAMUSCULAR | Status: AC
Start: 2017-05-04 — End: 2017-05-04
  Filled 2017-05-04: qty 5

## 2017-05-04 MED ORDER — CEFAZOLIN SODIUM-DEXTROSE 2-4 GM/100ML-% IV SOLN
INTRAVENOUS | Status: AC
  Filled 2017-05-04: qty 100

## 2017-05-04 MED ORDER — CEFAZOLIN SODIUM-DEXTROSE 1-4 GM/50ML-% IV SOLN
INTRAVENOUS | Status: AC
  Filled 2017-05-04: qty 50

## 2017-05-04 MED ORDER — LACTATED RINGERS IV SOLN
INTRAVENOUS | Status: DC
  Administered 2017-05-04: 08:00:00 via INTRAVENOUS

## 2017-05-04 MED ORDER — METOCLOPRAMIDE HCL 5 MG/ML IJ SOLN: 10.0000 mg | Freq: Once | INTRAMUSCULAR | Status: DC | PRN

## 2017-05-04 MED ORDER — ROCURONIUM BROMIDE 10 MG/ML (PF) SYRINGE
PREFILLED_SYRINGE | INTRAVENOUS | Status: AC
  Filled 2017-05-04: qty 5

## 2017-05-04 MED ORDER — VANCOMYCIN HCL 1000 MG IV SOLR
INTRAVENOUS | Status: AC
  Filled 2017-05-04: qty 1000

## 2017-05-04 MED ORDER — OXYCODONE HCL 5 MG PO TABS
ORAL_TABLET | ORAL | Status: AC
  Filled 2017-05-04: qty 1

## 2017-05-04 MED ORDER — FENTANYL CITRATE (PF) 100 MCG/2ML IJ SOLN
25.0000 ug | INTRAMUSCULAR | Status: DC | PRN
  Administered 2017-05-04 (×2): 50 ug via INTRAVENOUS

## 2017-05-04 MED ORDER — SCOPOLAMINE 1 MG/3DAYS TD PT72: 1.0000 | MEDICATED_PATCH | Freq: Once | TRANSDERMAL | Status: DC | PRN

## 2017-05-04 MED ORDER — PROPOFOL 10 MG/ML IV BOLUS
INTRAVENOUS | Status: DC | PRN
  Administered 2017-05-04: 200 mg via INTRAVENOUS

## 2017-05-04 MED ORDER — LIDOCAINE HCL (CARDIAC) 20 MG/ML IV SOLN
INTRAVENOUS | Status: DC | PRN
  Administered 2017-05-04: 50 mg via INTRAVENOUS

## 2017-05-04 MED ORDER — LACTATED RINGERS IV SOLN
INTRAVENOUS | Status: DC
  Administered 2017-05-04: 13:00:00 via INTRAVENOUS

## 2017-05-04 MED ORDER — FENTANYL CITRATE (PF) 100 MCG/2ML IJ SOLN
INTRAMUSCULAR | Status: AC
  Filled 2017-05-04: qty 2

## 2017-05-04 MED ORDER — OMEPRAZOLE 20 MG PO CPDR: 20.0000 mg | DELAYED_RELEASE_CAPSULE | Freq: Every day | ORAL | 0 refills | Status: DC

## 2017-05-04 MED ORDER — OXYCODONE HCL 5 MG PO TABS: ORAL_TABLET | ORAL | 0 refills | Status: AC

## 2017-05-04 MED ORDER — CEFAZOLIN SODIUM 1 G IJ SOLR: 1.0000 g | Freq: Once | INTRAMUSCULAR | Status: DC

## 2017-05-04 MED ORDER — ONDANSETRON HCL 4 MG/2ML IJ SOLN
INTRAMUSCULAR | Status: DC | PRN
  Administered 2017-05-04: 4 mg via INTRAVENOUS

## 2017-05-04 MED ORDER — ONDANSETRON HCL 4 MG/2ML IJ SOLN
INTRAMUSCULAR | Status: AC
  Filled 2017-05-04: qty 2

## 2017-05-04 MED ORDER — DEXAMETHASONE SODIUM PHOSPHATE 4 MG/ML IJ SOLN
INTRAMUSCULAR | Status: DC | PRN
  Administered 2017-05-04: 10 mg via INTRAVENOUS

## 2017-05-04 MED ORDER — EPHEDRINE SULFATE 50 MG/ML IJ SOLN
INTRAMUSCULAR | Status: DC | PRN
  Administered 2017-05-04: 20 mg via INTRAVENOUS

## 2017-05-04 MED ORDER — SUGAMMADEX SODIUM 200 MG/2ML IV SOLN
INTRAVENOUS | Status: AC
  Filled 2017-05-04: qty 2

## 2017-05-04 MED ORDER — MEPERIDINE HCL 25 MG/ML IJ SOLN: 6.2500 mg | INTRAMUSCULAR | Status: DC | PRN

## 2017-05-04 MED ORDER — SUGAMMADEX SODIUM 200 MG/2ML IV SOLN
INTRAVENOUS | Status: DC | PRN
  Administered 2017-05-04: 200 mg via INTRAVENOUS

## 2017-05-04 MED ORDER — EPINEPHRINE 30 MG/30ML IJ SOLN
INTRAMUSCULAR | Status: AC
  Filled 2017-05-04: qty 1

## 2017-05-04 MED ORDER — DEXAMETHASONE SODIUM PHOSPHATE 10 MG/ML IJ SOLN
INTRAMUSCULAR | Status: AC
  Filled 2017-05-04: qty 1

## 2017-05-04 MED ORDER — ACETAMINOPHEN 500 MG PO TABS: 1000.0000 mg | ORAL_TABLET | Freq: Three times a day (TID) | ORAL | 0 refills | Status: AC

## 2017-05-04 MED ORDER — MELOXICAM 7.5 MG PO TABS: 7.5000 mg | ORAL_TABLET | Freq: Every day | ORAL | 2 refills | Status: AC

## 2017-05-04 MED ORDER — EPHEDRINE 5 MG/ML INJ
INTRAVENOUS | Status: AC
  Filled 2017-05-04: qty 10

## 2017-05-04 MED ORDER — CHLORHEXIDINE GLUCONATE 4 % EX LIQD: 60.0000 mL | Freq: Once | CUTANEOUS | Status: DC

## 2017-05-04 MED ORDER — TRANEXAMIC ACID 1000 MG/10ML IV SOLN
1000.0000 mg | INTRAVENOUS | Status: AC
  Administered 2017-05-04: 1000 mg via INTRAVENOUS
  Filled 2017-05-04: qty 10

## 2017-05-04 MED ORDER — FENTANYL CITRATE (PF) 100 MCG/2ML IJ SOLN
50.0000 ug | INTRAMUSCULAR | Status: DC | PRN
  Administered 2017-05-04: 100 ug via INTRAVENOUS

## 2017-05-04 MED ORDER — MIDAZOLAM HCL 2 MG/2ML IJ SOLN
1.0000 mg | INTRAMUSCULAR | Status: DC | PRN
  Administered 2017-05-04: 2 mg via INTRAVENOUS

## 2017-05-04 MED ORDER — ROCURONIUM BROMIDE 100 MG/10ML IV SOLN
INTRAVENOUS | Status: DC | PRN
  Administered 2017-05-04: 50 mg via INTRAVENOUS
  Administered 2017-05-04: 20 mg via INTRAVENOUS
  Administered 2017-05-04 (×2): 10 mg via INTRAVENOUS

## 2017-05-04 MED ORDER — FENTANYL CITRATE (PF) 100 MCG/2ML IJ SOLN
INTRAMUSCULAR | Status: AC
Start: 2017-05-04 — End: 2017-05-04
  Filled 2017-05-04: qty 2

## 2017-05-04 MED ORDER — MIDAZOLAM HCL 2 MG/2ML IJ SOLN
INTRAMUSCULAR | Status: AC
  Filled 2017-05-04: qty 2

## 2017-05-04 MED ORDER — ROPIVACAINE HCL 5 MG/ML IJ SOLN
INTRAMUSCULAR | Status: DC | PRN
  Administered 2017-05-04: 30 mL via EPIDURAL

## 2017-05-04 MED ORDER — CEFAZOLIN SODIUM-DEXTROSE 2-4 GM/100ML-% IV SOLN
2.0000 g | INTRAVENOUS | Status: AC
  Administered 2017-05-04: 2 g via INTRAVENOUS

## 2017-05-04 MED ORDER — ONDANSETRON HCL 4 MG PO TABS: 4.0000 mg | ORAL_TABLET | Freq: Three times a day (TID) | ORAL | 1 refills | Status: AC | PRN

## 2017-05-04 MED ORDER — SODIUM CHLORIDE 0.9 % IR SOLN
Status: DC | PRN
Start: 2017-05-04 — End: 2017-05-04
  Administered 2017-05-04: 500 mL
  Administered 2017-05-04: 1000 mL

## 2017-05-04 SURGICAL SUPPLY — 73 items
2.5MM X 200MM PIN ×3 IMPLANT
3.2MM DRILL BIT ×3 IMPLANT
BASEPLATE GLENOSPHERE 25 STD (Miscellaneous) ×2 IMPLANT
BASEPLATE GLENOSPHERE 25MM STD (Miscellaneous) ×1 IMPLANT
BENZOIN TINCTURE PRP APPL 2/3 (GAUZE/BANDAGES/DRESSINGS) ×3 IMPLANT
BLADE HEX COATED 2.75 (ELECTRODE) ×3 IMPLANT
BLADE SAGITTAL 25.0X1.27X90 (BLADE) ×2 IMPLANT
BLADE SAGITTAL 25.0X1.27X90MM (BLADE) ×1
BLADE SURG 10 STRL SS (BLADE) ×3 IMPLANT
BLADE SURG 15 STRL LF DISP TIS (BLADE) ×2 IMPLANT
BLADE SURG 15 STRL SS (BLADE) ×4
BNDG COHESIVE 4X5 TAN STRL (GAUZE/BANDAGES/DRESSINGS) ×3 IMPLANT
CAP SHOULDER REVTOTAL 2 ×3 IMPLANT
CHLORAPREP W/TINT 26ML (MISCELLANEOUS) ×3 IMPLANT
CLOSURE WOUND 1/2 X4 (GAUZE/BANDAGES/DRESSINGS) ×1
COVER BACK TABLE 60X90IN (DRAPES) ×3 IMPLANT
DECANTER SPIKE VIAL GLASS SM (MISCELLANEOUS) IMPLANT
DRAPE IMP U-DRAPE 54X76 (DRAPES) ×3 IMPLANT
DRAPE INCISE IOBAN 66X45 STRL (DRAPES) ×6 IMPLANT
DRAPE U-SHAPE 76X120 STRL (DRAPES) ×6 IMPLANT
DRSG AQUACEL AG ADV 3.5X 6 (GAUZE/BANDAGES/DRESSINGS) ×3 IMPLANT
DRSG AQUACEL AG ADV 3.5X10 (GAUZE/BANDAGES/DRESSINGS) ×3 IMPLANT
DRSG MEPILEX BORDER 4X8 (GAUZE/BANDAGES/DRESSINGS) IMPLANT
ELECT BLADE 4.0 EZ CLEAN MEGAD (MISCELLANEOUS) ×3
ELECT REM PT RETURN 9FT ADLT (ELECTROSURGICAL) ×3
ELECTRODE BLDE 4.0 EZ CLN MEGD (MISCELLANEOUS) ×1 IMPLANT
ELECTRODE REM PT RTRN 9FT ADLT (ELECTROSURGICAL) ×1 IMPLANT
GAUZE XEROFORM 1X8 LF (GAUZE/BANDAGES/DRESSINGS) IMPLANT
GLOVE BIO SURGEON STRL SZ 6.5 (GLOVE) ×2 IMPLANT
GLOVE BIO SURGEON STRL SZ8 (GLOVE) IMPLANT
GLOVE BIO SURGEONS STRL SZ 6.5 (GLOVE) ×1
GLOVE BIOGEL PI IND STRL 7.0 (GLOVE) ×3 IMPLANT
GLOVE BIOGEL PI IND STRL 8 (GLOVE) ×1 IMPLANT
GLOVE BIOGEL PI INDICATOR 7.0 (GLOVE) ×6
GLOVE BIOGEL PI INDICATOR 8 (GLOVE) ×2
GLOVE ECLIPSE 6.5 STRL STRAW (GLOVE) ×6 IMPLANT
GLOVE ECLIPSE 8.0 STRL XLNG CF (GLOVE) ×6 IMPLANT
GOWN STRL REUS W/ TWL LRG LVL3 (GOWN DISPOSABLE) ×2 IMPLANT
GOWN STRL REUS W/TWL LRG LVL3 (GOWN DISPOSABLE) ×4
GOWN STRL REUS W/TWL XL LVL3 (GOWN DISPOSABLE) ×3 IMPLANT
HANDPIECE INTERPULSE COAX TIP (DISPOSABLE) ×2
KIT STABILIZATION SHOULDER (MISCELLANEOUS) ×3 IMPLANT
MANIFOLD NEPTUNE II (INSTRUMENTS) ×3 IMPLANT
NS IRRIG 1000ML POUR BTL (IV SOLUTION) ×3 IMPLANT
PACK ARTHROSCOPY DSU (CUSTOM PROCEDURE TRAY) ×3 IMPLANT
PACK BASIN DAY SURGERY FS (CUSTOM PROCEDURE TRAY) ×3 IMPLANT
PAD ORTHO SHOULDER 7X19 LRG (SOFTGOODS) ×3 IMPLANT
PENCIL BUTTON HOLSTER BLD 10FT (ELECTRODE) ×3 IMPLANT
RESTRAINT HEAD UNIVERSAL NS (MISCELLANEOUS) ×3 IMPLANT
SCREW BONE 6.5X40 SM (Screw) ×3 IMPLANT
SET HNDPC FAN SPRY TIP SCT (DISPOSABLE) ×1 IMPLANT
SHEET MEDIUM DRAPE 40X70 STRL (DRAPES) ×3 IMPLANT
SLEEVE SCD COMPRESS KNEE MED (MISCELLANEOUS) ×3 IMPLANT
SLING ARM FOAM STRAP LRG (SOFTGOODS) IMPLANT
SLING ARM MED ADULT FOAM STRAP (SOFTGOODS) IMPLANT
SLING ARM XL FOAM STRAP (SOFTGOODS) IMPLANT
SPONGE LAP 18X18 X RAY DECT (DISPOSABLE) ×6 IMPLANT
SPONGE LAP 4X18 X RAY DECT (DISPOSABLE) IMPLANT
STRIP CLOSURE SKIN 1/2X4 (GAUZE/BANDAGES/DRESSINGS) ×2 IMPLANT
SUT ETHIBOND 2 OS 4 DA (SUTURE) ×12 IMPLANT
SUT FIBERWIRE #5 38 CONV NDL (SUTURE) ×12
SUT MNCRL AB 3-0 PS2 18 (SUTURE) IMPLANT
SUT MNCRL AB 4-0 PS2 18 (SUTURE) ×3 IMPLANT
SUT MON AB 2-0 CT1 36 (SUTURE) IMPLANT
SUT VIC AB 0 CT1 18XCR BRD 8 (SUTURE) ×1 IMPLANT
SUT VIC AB 0 CT1 8-18 (SUTURE) ×2
SUT VIC AB 2-0 SH 27 (SUTURE) ×4
SUT VIC AB 2-0 SH 27XBRD (SUTURE) ×2 IMPLANT
SUTURE FIBERWR #5 38 CONV NDL (SUTURE) ×4 IMPLANT
SYR BULB 3OZ (MISCELLANEOUS) IMPLANT
TOWEL OR 17X24 6PK STRL BLUE (TOWEL DISPOSABLE) ×6 IMPLANT
TOWEL OR NON WOVEN STRL DISP B (DISPOSABLE) ×6 IMPLANT
TUBE SUCTION HIGH CAP CLEAR NV (SUCTIONS) ×3 IMPLANT

## 2017-05-04 NOTE — Op Note (Signed)
Orthopaedic Surgery Operative Note (CSN: [id number])  Anthem Edward Horton  03-Oct-1960 Date of Surgery: 05/04/2017   Diagnoses:  LEFT SHOULDER IRREPARABLE ROTATOR CUFF TEAR  Procedures:   * LEFT REVERSE  TOTAL SHOULDER ARTHROPLASTY 23472   Operative Finding Successful completion of planned procedure.  Good stability at end of case.  Subscap repair stable to 30 ER.  Scar around axillary nerve noted but tug test found to be normal at end of case.   Post-operative plan: The patient will be NWB in sling.  The patient will be kept in PACU for one additional dose of antibiotics and then sent home.  DVT prophylaxis not indicated in isolated upper extremity surgery patient with no specific risks factors.  Pain control with PRN pain medication preferring oral medicines.  Follow up plan will be scheduled in approximately 10-14 days for wound check and XR.  Post-Op Diagnosis: Same Surgeons:Primary: Hiram Gash, MD Assistants:None Location: [place] OR ROOM 6 Anesthesia: General Antibiotics: Ancef 2g preop Tourniquet time: * No tourniquets in log * Estimated Blood Loss: 465 Complications: None Specimens: None Implants: Implant Name Type Inv. Item Serial No. Manufacturer Lot No. LRB No. Used Action  Standard Baseplate   [id number] [employer] [id number] Left 1 Implanted  6.5 x 55m Central Screw    [employer] STERILIZED ON SET Left 1 Implanted  4.5 x 366mperipheral screw    [employer] STERILIZED ON SET Left 2 Implanted  Standard Glenosphere    [employer] [id number] 1 Implanted  Reversed Tray    [employer] [id number] 1 Implanted  Reversed Insert    [employer] [id number] 1 Implanted  Standard PTC Humeral Stem    [employer] [id number] 1 Implanted    Indications for Surgery:   Edward Horton a [age].o. 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  We did non-op treatment as well [facility] but ended up doing SAD,  tuberoplasty and biceps tenotomy with no relief.  Axillary nerve normal preop. Benefits and risks of operative and nonoperative management were discussed prior to surgery with patient/guardian(s) and informed consent form was completed.  Specific risks including infection, need for additional surgery, longevity of components, infection, periprosthetic fracture and dislocation   Procedure:   The patient was identified in the preoperative holding area where the surgical site was marked. The patient was taken to the OR where a procedural timeout was called and the above noted anesthesia was induced.  The patient was positioned beachchair on allen with spider.  Preoperative antibiotics were dosed.  The patient's left shoulder was prepped and draped in the usual sterile fashion.  A second preoperative timeout was called.      Standard deltopectoral approach was performed with a #10 blade. We dissected down to the subcutaneous tissues and the cephalic vein was taken laterally with the deltoid. Clavipectoral fascia was incised in line with the incision. Deep retractors were placed. The long of the biceps tendon was identified and there was significant tenosynovitis present.  Tenodesis was performed to the pectoralis tendon with #2 Ethibond. The remaining biceps was followed up into the rotator interval where it was released.   The subscapularis was torn off on inspection.  We mobilized and continued our dissection down in a full thickness layer with capsule along the humeral neck extending inferiorly around the humeral head. We continued releasing the capsule directly off of the osteophytes inferiorly all the way  around the corner. This allowed [place] to dislocate the humeral head.   The humeral head had evidence of mild osteoarthritic wear .   The rotator cuff was carefully examined and noted to be irreperably torn.  The decision was confirmed that a reverse total shoulder was indicated for this patient.  There  were osteophytes along the inferior humeral neck. The osteophytes were removed with an osteotome and a rongeur.  Osteophytes were removed with a rongeur and an osteotome and the anatomic neck was well visualized.     A humeral cutting guide was inserted down the intramedullary canal. The version was set at 20 of retroversion. Humeral osteotomy was performed with an oscillating saw. The head fragment was passed off the back table. A starter awl was used to open the humeral canal. We next used T-handle straight sound reamers to ream up to an appropriate fit. A chisel was used to remove proximal humeral bone. We then broached starting with a size one broach and broaching up to 2 which obtained an appropriate fit. The broach handle was removed. A cut protector was placed. The broach handle was removed and a cut protector was placed. The humerus was retracted posteriorly and we turned our attention to glenoid exposure.  The subscapularis was again identified and immediately we took care to palpate the axillary nerve anteriorly and verify its position with gentle palpation as well as the tug test.  We then released the SGHL with bovie cautery prior to placing a curved mayo at the junction of the anterior glenoid well above the axillary nerve and bluntly dissecting the subscapularis from the capsule.  We then carefully protected the axillary nerve as we gently released the inferior capsule to fully mobilize the subscapularis.  An anterior deltoid retractor was then placed as well as a small Hohmann retractor superiorly.   The glenoid was inspected and had minimal arthritis but some signs of proximal migration.  The glenoid drill guide was placed and used to drill a guide pin in the center, inferior position. The glenoid face was then reamed concentrically over the guide wire. The center hole was drilled over the guidepin in a near anatomic angle of version. Next the glenoid vault was drilled back to a depth of 35  mm.  We tapped and then placed a 35m size baseplate with0  lateralization was selected with a 6.5 mm x 35 mm length central screw.  The base plate was screwed into the glenoid vault obtaining secure fixation. We next placed superior and inferior locking screws for additional fixation.  Next a 39 mm glenosphere was selected and impacted onto the baseplate. The center screw was tightened.  We turned attention back to the humeral side. The cut protector was removed. We trialed with multiple size tray and polyethylene options and selected a 6 which provided good stability and range of motion without excess soft tissue tension. The offset was dialed in to match the normal anatomy. The shoulder was trialed.  There was good ROM in all planes and the shoulder was stable with no inferior translation.  The real humeral implants were opened after again confirming sizes.  The trial was removed. #5 Fiberwire sutures passed through the humeral neck for subscap repair. The humeral component was press-fit obtaining a secure fit. A +0 high offset tray was selected and impacted onto the stem.  A 39+6 polyethylene liner was impacted onto the stem.  The joint was reduced and thoroughly irrigated with pulsatile lavage. Subscap was repaired  back with #5 Fiberwire sutures through bone tunnels. Hemostasis was obtained. The deltopectoral interval was reapproximated with #1 Ethibond. The subcutaneous tissues were closed with 2-0 Vicryl and the skin was closed with running monocryl.    The wounds were cleaned and dried and an Aquacel dressing was placed. The drapes taken down. The arm was placed into sling with abduction pillow. Patient was awakened, extubated, and transferred to the recovery room in stable condition. There were no intraoperative complications. The sponge, needle, and attention counts were  correct at the end of the case.

## 2017-05-04 NOTE — Anesthesia Preprocedure Evaluation (Signed)
Anesthesia Evaluation  Patient identified by MRN, date of birth, ID band Patient awake    Reviewed: Allergy & Precautions, NPO status , Patient's Chart, lab work & pertinent test results  Airway Mallampati: II  TM Distance: >3 FB Neck ROM: Full    Dental no notable dental hx. (+) Poor Dentition, Missing   Pulmonary neg pulmonary ROS,    Pulmonary exam normal breath sounds clear to auscultation       Cardiovascular negative cardio ROS Normal cardiovascular exam Rhythm:Regular Rate:Normal     Neuro/Psych negative neurological ROS  negative psych ROS   GI/Hepatic negative GI ROS, Neg liver ROS,   Endo/Other  negative endocrine ROS  Renal/GU negative Renal ROS  negative genitourinary   Musculoskeletal negative musculoskeletal ROS (+)   Abdominal   Peds negative pediatric ROS (+)  Hematology negative hematology ROS (+)   Anesthesia Other Findings   Reproductive/Obstetrics negative OB ROS                             Anesthesia Physical Anesthesia Plan  ASA: II  Anesthesia Plan: General   Post-op Pain Management: GA combined w/ Regional for post-op pain   Induction: Intravenous  PONV Risk Score and Plan: 2 and Ondansetron, Treatment may vary due to age or medical condition and Midazolam  Airway Management Planned: Oral ETT  Additional Equipment:   Intra-op Plan:   Post-operative Plan: Extubation in OR  Informed Consent: I have reviewed the patients History and Physical, chart, labs and discussed the procedure including the risks, benefits and alternatives for the proposed anesthesia with the patient or authorized representative who has indicated his/her understanding and acceptance.   Dental advisory given  Plan Discussed with: CRNA  Anesthesia Plan Comments:         Anesthesia Quick Evaluation

## 2017-05-04 NOTE — Discharge Instructions (Signed)

## 2017-05-04 NOTE — Interval H&P Note (Signed)
Discussed case, risks and benefits with patient again.  All questions answered, no change to history.  Tatum Massman MD  

## 2017-05-04 NOTE — Anesthesia Procedure Notes (Signed)
Anesthesia Regional Block: Supraclavicular block   Pre-Anesthetic Checklist: ,, timeout performed, Correct Patient, Correct Site, Correct Laterality, Correct Procedure, Correct Position, site marked, Risks and benefits discussed,  Surgical consent,  Pre-op evaluation,  At surgeon's request and post-op pain management  Laterality: Left and Upper  Prep: Maximum Sterile Barrier Precautions used, chloraprep       Needles:  Injection technique: Single-shot  Needle Type: Echogenic Stimulator Needle     Needle Length: 10cm      Additional Needles:   Procedures:,,,, ultrasound used (permanent image in chart),,,,  Narrative:  Start time: 05/04/2017 8:40 AM End time: 05/04/2017 8:52 AM Injection made incrementally with aspirations every 5 mL.  Performed by: Personally  Anesthesiologist: Phillips Groutarignan, Demon Volante, MD  Additional Notes: Risks, benefits and alternative to block explained extensively.  Patient tolerated procedure well, without complications.

## 2017-05-04 NOTE — Transfer of Care (Signed)
Immediate Anesthesia Transfer of Care Note  Patient: Normajean GlasgowSteven Riechers  Procedure(s) Performed: LEFT REVERSE  TOTAL SHOULDER ARTHROPLASTY (Left Shoulder)  Patient Location: PACU  Anesthesia Type:General  Level of Consciousness: awake and sedated  Airway & Oxygen Therapy: Patient Spontanous Breathing and Patient connected to face mask oxygen  Post-op Assessment: Report given to RN and Post -op Vital signs reviewed and stable  Post vital signs: Reviewed and stable  Last Vitals:  Vitals:   05/04/17 0900 05/04/17 0905  BP: (!) 128/98 (!) 135/91  Pulse: 85 85  Resp: 15 13  Temp:    SpO2: 100% 96%    Last Pain:  Vitals:   05/04/17 0847  TempSrc:   PainSc: 0-No pain      Patients Stated Pain Goal: 3 (05/04/17 0841)  Complications: No apparent anesthesia complications

## 2017-05-04 NOTE — Anesthesia Procedure Notes (Signed)
Procedure Name: Intubation Performed by: York GricePearson, Gershom Brobeck W, CRNA Pre-anesthesia Checklist: Patient identified, Emergency Drugs available, Suction available and Patient being monitored Patient Re-evaluated:Patient Re-evaluated prior to induction Oxygen Delivery Method: Circle system utilized Preoxygenation: Pre-oxygenation with 100% oxygen Induction Type: IV induction Ventilation: Mask ventilation without difficulty Laryngoscope Size: Miller and 2 Grade View: Grade II Tube type: Oral Tube size: 7.0 mm Number of attempts: 1 Airway Equipment and Method: Stylet Placement Confirmation: ETT inserted through vocal cords under direct vision,  positive ETCO2 and breath sounds checked- equal and bilateral Secured at: 23 (23) cm Tube secured with: Tape Dental Injury: Teeth and Oropharynx as per pre-operative assessment

## 2017-05-04 NOTE — Progress Notes (Signed)
Assisted Dr. Carignan with left, ultrasound guided, supraclavicular block. Side rails up, monitors on throughout procedure. See vital signs in flow sheet. Tolerated Procedure well. 

## 2017-05-04 NOTE — Anesthesia Postprocedure Evaluation (Signed)
Anesthesia Post Note  Patient: Normajean GlasgowSteven Halberg  Procedure(s) Performed: LEFT REVERSE  TOTAL SHOULDER ARTHROPLASTY (Left Shoulder)     Patient location during evaluation: PACU Anesthesia Type: General Level of consciousness: awake and alert Pain management: pain level controlled Vital Signs Assessment: post-procedure vital signs reviewed and stable Respiratory status: spontaneous breathing, nonlabored ventilation, respiratory function stable and patient connected to nasal cannula oxygen Cardiovascular status: blood pressure returned to baseline and stable Postop Assessment: no apparent nausea or vomiting Anesthetic complications: no    Last Vitals:  Vitals:   05/04/17 1300 05/04/17 1315  BP: 116/77 114/81  Pulse: 84 87  Resp: 14 17  Temp:    SpO2: 96% 95%    Last Pain:  Vitals:   05/04/17 1315  TempSrc:   PainSc: 3                  Phillips Groutarignan, Leonard Feigel

## 2017-05-07 ENCOUNTER — Encounter (HOSPITAL_BASED_OUTPATIENT_CLINIC_OR_DEPARTMENT_OTHER): Payer: Self-pay | Admitting: Orthopaedic Surgery

## 2019-04-03 IMAGING — MR MR SHOULDER*L* W/O CM
5 series · 33 of 40 positions shown · non-contrast
Comparison: None.

CLINICAL DATA: Left shoulder pain since the patient suffered a trip
and fall in a parking lot 01/22/2017.

EXAM:
MRI OF THE LEFT SHOULDER WITHOUT CONTRAST
TECHNIQUE: Multiplanar, multisequence MR imaging of the shoulder was performed.
No intravenous contrast was administered.

[Series 4: T2 fat-sat · axial · 4.0mm · 0.55mm/px · z∈[-60,+35]mm · 8 of 24 slices shown (1 of 3)]
[im 1/24]
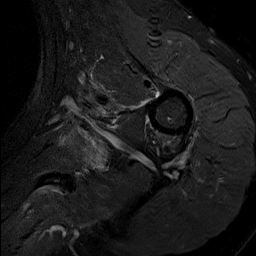
[im 4/24]
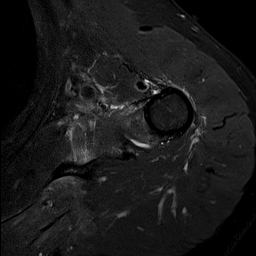
[im 7/24]
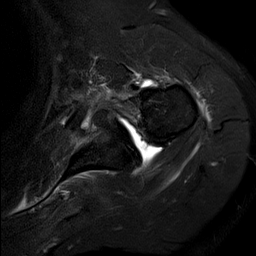
[im 10/24]
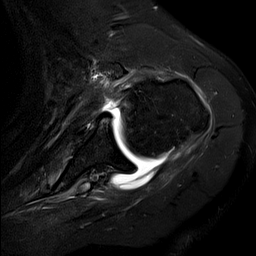
[im 14/24]
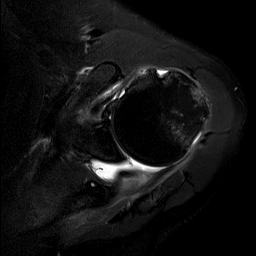
[im 17/24]
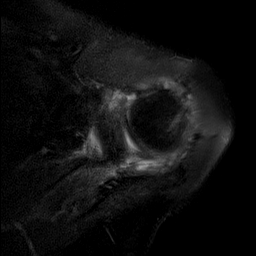
[im 20/24]
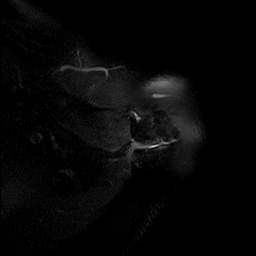
[im 24/24]
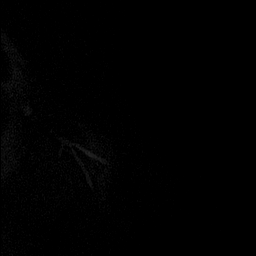

[Series 5: T2 fat-sat · oblique · 4.0mm · 0.55mm/px · 8 of 22 slices shown (2 of 3)]
[im 1/22]
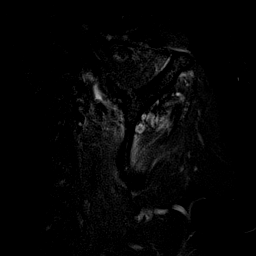
[im 4/22]
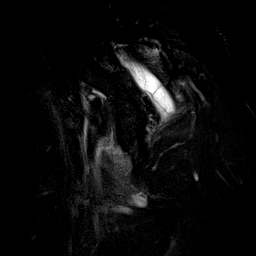
[im 7/22]
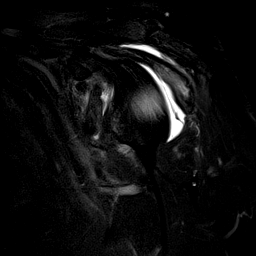
[im 10/22]
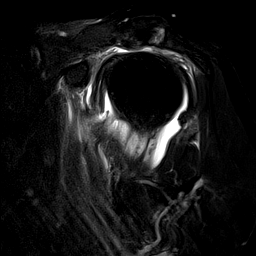
[im 13/22]
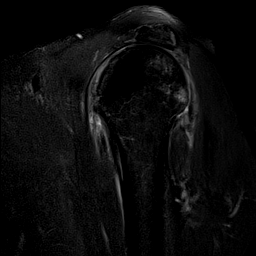
[im 16/22]
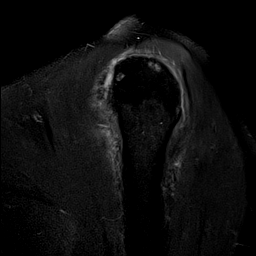
[im 19/22]
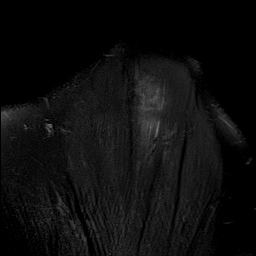
[im 22/22]
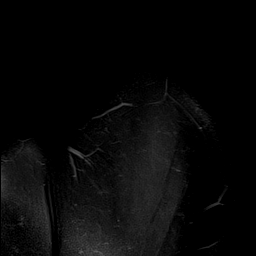

[Series 6: T1 · oblique · 4.0mm · 0.22mm/px · 1 of 22 slices shown]
[im 1/22]
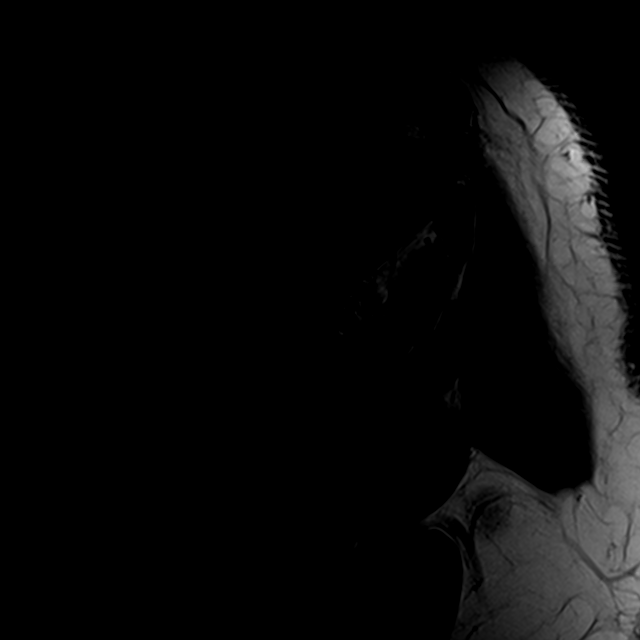

[Series 7: T2 fat-sat · oblique · 4.0mm · 0.27mm/px · 8 of 23 slices shown (3 of 3)]
[im 1/23]
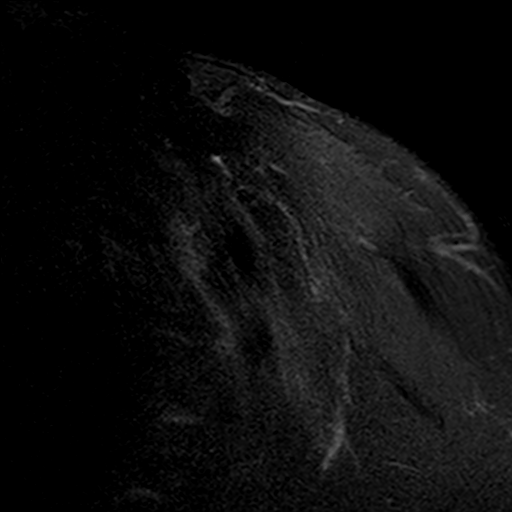
[im 4/23]
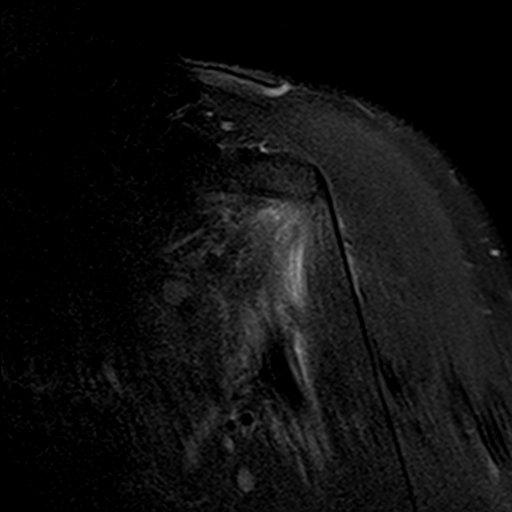
[im 7/23]
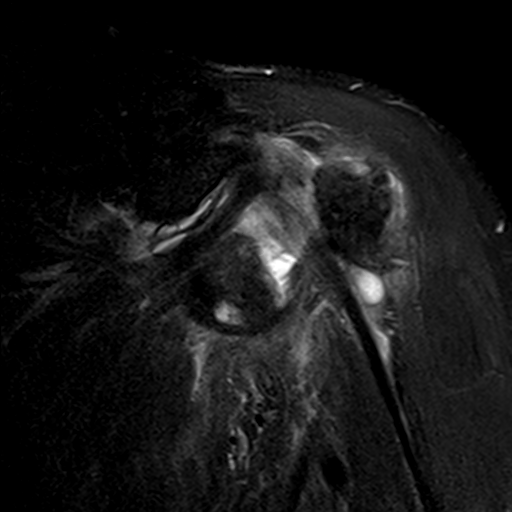
[im 10/23]
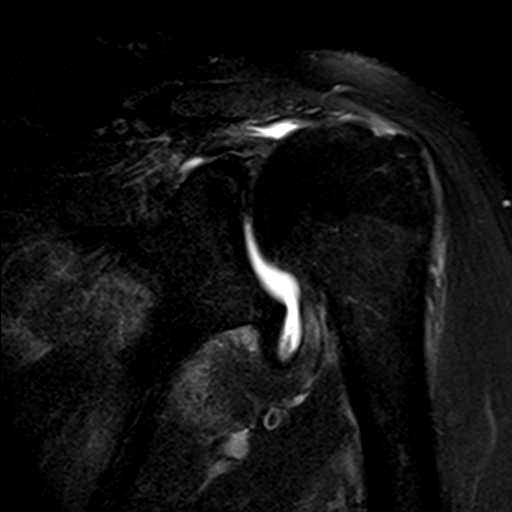
[im 13/23]
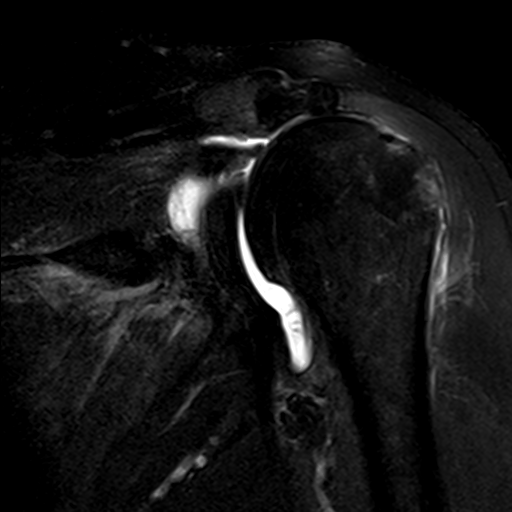
[im 16/23]
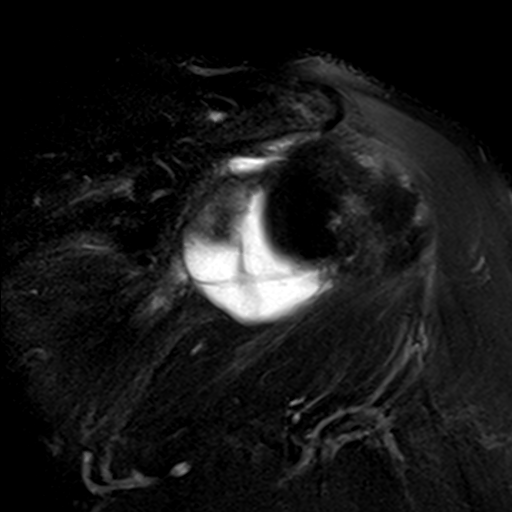
[im 19/23]
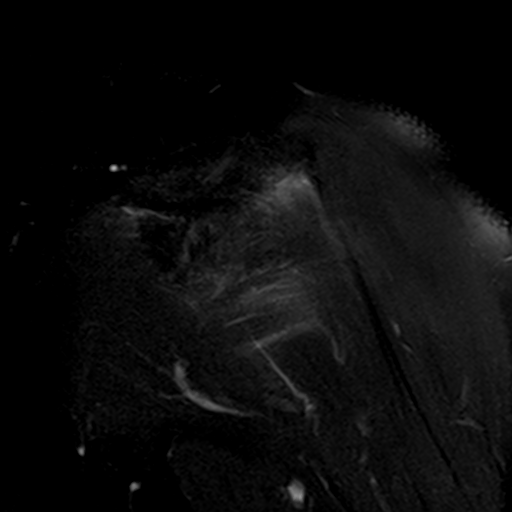
[im 23/23]
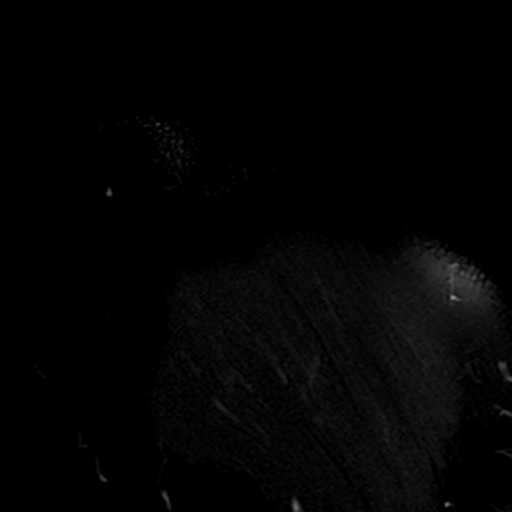

[Series 8: PD · oblique · 4.0mm · 0.44mm/px · 8 of 23 slices shown]
[im 1/23]
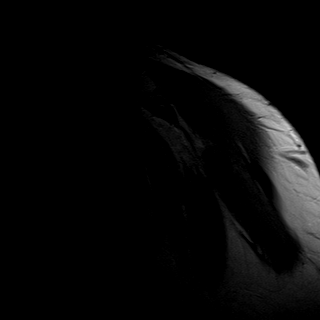
[im 4/23]
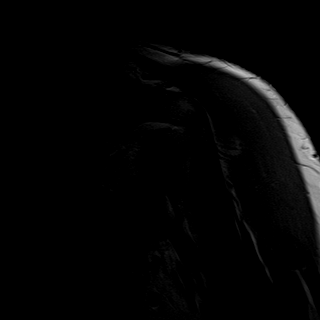
[im 7/23]
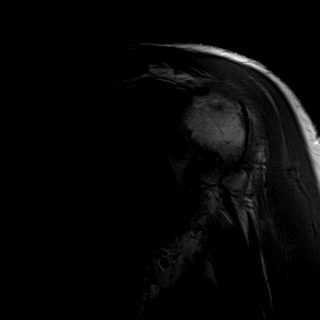
[im 10/23]
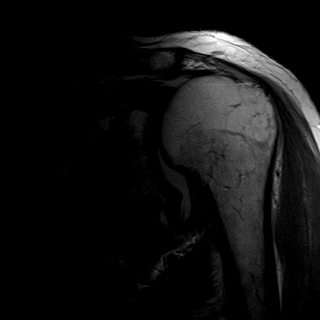
[im 13/23]
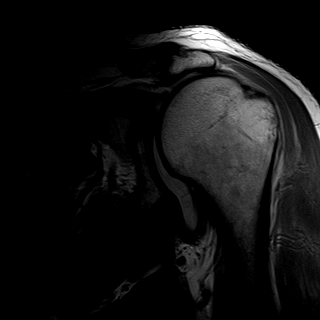
[im 16/23]
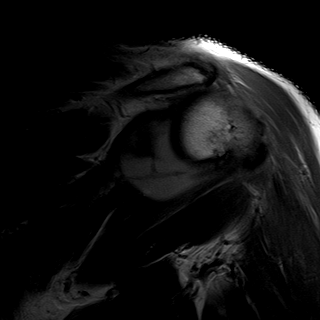
[im 19/23]
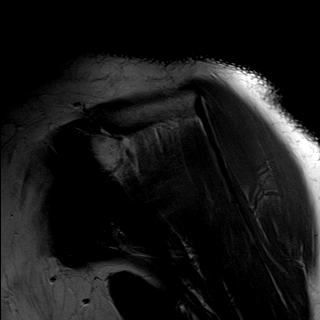
[im 23/23]
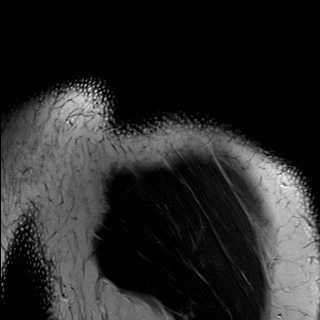

[33 of 40 positions shown; findings below may reference images not displayed]

FINDINGS: Rotator cuff: The supraspinatus and infraspinatus tendons are
completely torn and retracted to the glenoid, 4.5-5.5 cm. The
subscapularis is almost completely torn with 1-2 cm of retraction. A
thin band of the inferior fibers of the tendon are intact.

Muscles: There is mild fatty atrophy of the subscapularis and
infraspinatus. The supraspinatus appears preserved.

Biceps long head: The tendon is medially subluxed out of the
bicipital groove into the glenohumeral joint. Extensive
intrasubstance increased T2 signal within the tendon consistent with
tendinopathy is identified.

Acromioclavicular Joint: Moderate to moderately severe degenerative
change is seen. Type II acromion. The patient has a meso-acromion
type os acromiale. There is fluid in the subacromial/subdeltoid
bursa.

Glenohumeral Joint: The humeral head is high-riding secondary to the
patient's rotator cuff tear.

Labrum: The superior labrum is severely degenerated and torn. The
anterior and posterior labrum appear diminutive without focal tear.

Bones: There is a bone contusion in the anterior, inferior glenoid
without fracture identified. Small Hill-Sachs lesion with mild
marrow edema is also identified.

Other: None.
IMPRESSION: Complete supraspinatus and infraspinatus tendon tears with 4.5-5.5
cm of retraction and mild infraspinatus atrophy. The subscapularis
is nearly completely torn with 1-2 cm of retraction and mild
atrophy.

Severe appearing tendinopathy of the long head of biceps. The tendon
is medially subluxed out of the bicipital groove into the
glenohumeral joint.

Bone contusion in the anterior, inferior glenoid consistent with
anterior shoulder dislocation. No fracture. Hill-Sachs lesion also
noted. The anterior, inferior labrum is diminutive but no focal tear
is identified.

Markedly degenerated and torn superior labrum.

Os acromiale.  Moderate acromioclavicular osteoarthritis also noted.

Subacromial/subdeltoid bursitis.

## 2021-01-14 ENCOUNTER — Other Ambulatory Visit (HOSPITAL_BASED_OUTPATIENT_CLINIC_OR_DEPARTMENT_OTHER): Payer: Self-pay

## 2021-01-14 ENCOUNTER — Ambulatory Visit: Payer: Self-pay | Attending: Internal Medicine

## 2021-01-14 DIAGNOSIS — Z23 Encounter for immunization: Secondary | ICD-10-CM

## 2021-01-14 MED ORDER — INFLUENZA VAC SPLIT QUAD 0.5 ML IM SUSY
PREFILLED_SYRINGE | INTRAMUSCULAR | 0 refills | Status: DC
  Filled 2021-01-14: qty 0.5, 1d supply, fill #0

## 2021-01-14 NOTE — Progress Notes (Signed)
   Covid-19 Vaccination Clinic  Name:  Edward Horton    MRN: [id number] DOB: 1960-10-25  01/14/2021  [name] was observed post Covid-19 immunization for 15 minutes without incident. He was provided with Vaccine Information Sheet and instruction to access the V-Safe system.   [name] was instructed to call 911 with any severe reactions post vaccine: Difficulty breathing  Swelling of face and throat  A fast heartbeat  A bad rash all over body  Dizziness and weakness   Immunizations Administered     Name Date Dose VIS Date Route   Pfizer Covid-19 Vaccine Bivalent Booster 01/14/2021 10:32 AM 0.3 mL 11/11/2020 Intramuscular   Manufacturer: [employer], [employer]   Lot: [id number]   NDC: [phone]

## 2021-02-02 ENCOUNTER — Other Ambulatory Visit (HOSPITAL_BASED_OUTPATIENT_CLINIC_OR_DEPARTMENT_OTHER): Payer: Self-pay

## 2021-02-02 MED ORDER — PFIZER COVID-19 VAC BIVALENT 30 MCG/0.3ML IM SUSP
INTRAMUSCULAR | 0 refills | Status: AC
  Filled 2021-02-02: qty 0.3, 1d supply, fill #0

## 2021-07-10 DIAGNOSIS — Z23 Encounter for immunization: Secondary | ICD-10-CM | POA: Diagnosis not present

## 2021-07-10 DIAGNOSIS — S61001A Unspecified open wound of right thumb without damage to nail, initial encounter: Secondary | ICD-10-CM | POA: Diagnosis not present

## 2022-02-24 DIAGNOSIS — J019 Acute sinusitis, unspecified: Secondary | ICD-10-CM | POA: Diagnosis not present

## 2022-02-24 DIAGNOSIS — R112 Nausea with vomiting, unspecified: Secondary | ICD-10-CM | POA: Diagnosis not present

## 2022-02-24 DIAGNOSIS — R519 Headache, unspecified: Secondary | ICD-10-CM | POA: Diagnosis not present

## 2022-03-10 DIAGNOSIS — H6120 Impacted cerumen, unspecified ear: Secondary | ICD-10-CM | POA: Diagnosis not present

## 2022-03-10 DIAGNOSIS — R519 Headache, unspecified: Secondary | ICD-10-CM | POA: Diagnosis not present

## 2022-03-21 DIAGNOSIS — M25511 Pain in right shoulder: Secondary | ICD-10-CM | POA: Diagnosis not present

## 2022-03-29 DIAGNOSIS — M25511 Pain in right shoulder: Secondary | ICD-10-CM | POA: Diagnosis not present

## 2022-04-08 DIAGNOSIS — M19011 Primary osteoarthritis, right shoulder: Secondary | ICD-10-CM | POA: Diagnosis not present

## 2022-04-15 DIAGNOSIS — M24112 Other articular cartilage disorders, left shoulder: Secondary | ICD-10-CM | POA: Diagnosis not present

## 2022-04-29 DIAGNOSIS — M24112 Other articular cartilage disorders, left shoulder: Secondary | ICD-10-CM | POA: Diagnosis not present

## 2022-05-02 DIAGNOSIS — N4289 Other specified disorders of prostate: Secondary | ICD-10-CM | POA: Diagnosis not present

## 2022-05-02 DIAGNOSIS — R109 Unspecified abdominal pain: Secondary | ICD-10-CM | POA: Diagnosis not present

## 2022-05-02 DIAGNOSIS — K5790 Diverticulosis of intestine, part unspecified, without perforation or abscess without bleeding: Secondary | ICD-10-CM | POA: Diagnosis not present

## 2022-05-02 DIAGNOSIS — R1110 Vomiting, unspecified: Secondary | ICD-10-CM | POA: Diagnosis not present

## 2022-05-02 DIAGNOSIS — R112 Nausea with vomiting, unspecified: Secondary | ICD-10-CM | POA: Diagnosis not present

## 2022-05-02 DIAGNOSIS — K7689 Other specified diseases of liver: Secondary | ICD-10-CM | POA: Diagnosis not present

## 2022-05-02 DIAGNOSIS — K8020 Calculus of gallbladder without cholecystitis without obstruction: Secondary | ICD-10-CM | POA: Diagnosis not present

## 2022-05-02 DIAGNOSIS — R1084 Generalized abdominal pain: Secondary | ICD-10-CM | POA: Diagnosis not present

## 2022-05-02 DIAGNOSIS — K529 Noninfective gastroenteritis and colitis, unspecified: Secondary | ICD-10-CM | POA: Diagnosis not present

## 2022-05-05 DIAGNOSIS — M24112 Other articular cartilage disorders, left shoulder: Secondary | ICD-10-CM | POA: Diagnosis not present

## 2022-05-10 DIAGNOSIS — M25511 Pain in right shoulder: Secondary | ICD-10-CM | POA: Diagnosis not present

## 2022-09-02 DIAGNOSIS — H9202 Otalgia, left ear: Secondary | ICD-10-CM | POA: Diagnosis not present

## 2022-09-02 DIAGNOSIS — M130 Polyarthritis, unspecified: Secondary | ICD-10-CM | POA: Diagnosis not present

## 2022-09-02 DIAGNOSIS — R5382 Chronic fatigue, unspecified: Secondary | ICD-10-CM | POA: Diagnosis not present

## 2022-09-02 DIAGNOSIS — R351 Nocturia: Secondary | ICD-10-CM | POA: Diagnosis not present

## 2022-09-20 DIAGNOSIS — H18513 Endothelial corneal dystrophy, bilateral: Secondary | ICD-10-CM | POA: Diagnosis not present

## 2022-09-22 DIAGNOSIS — R5382 Chronic fatigue, unspecified: Secondary | ICD-10-CM | POA: Diagnosis not present

## 2022-09-22 DIAGNOSIS — R634 Abnormal weight loss: Secondary | ICD-10-CM | POA: Diagnosis not present

## 2022-09-22 DIAGNOSIS — R972 Elevated prostate specific antigen [PSA]: Secondary | ICD-10-CM | POA: Diagnosis not present

## 2022-09-22 DIAGNOSIS — M130 Polyarthritis, unspecified: Secondary | ICD-10-CM | POA: Diagnosis not present

## 2022-09-22 DIAGNOSIS — R351 Nocturia: Secondary | ICD-10-CM | POA: Diagnosis not present

## 2022-09-23 ENCOUNTER — Other Ambulatory Visit: Payer: Self-pay | Admitting: Family Medicine

## 2022-09-23 DIAGNOSIS — R634 Abnormal weight loss: Secondary | ICD-10-CM

## 2022-09-28 DIAGNOSIS — Z1211 Encounter for screening for malignant neoplasm of colon: Secondary | ICD-10-CM | POA: Diagnosis not present

## 2022-10-04 ENCOUNTER — Ambulatory Visit
Admission: RE | Admit: 2022-10-04 | Discharge: 2022-10-04 | Disposition: A | Discharge status: Home / Self Care | Payer: BC Managed Care – PPO | Source: Ambulatory Visit | Attending: Family Medicine | Admitting: Family Medicine

## 2022-10-04 DIAGNOSIS — I700 Atherosclerosis of aorta: Secondary | ICD-10-CM | POA: Diagnosis not present

## 2022-10-04 DIAGNOSIS — R911 Solitary pulmonary nodule: Secondary | ICD-10-CM | POA: Diagnosis not present

## 2022-10-04 DIAGNOSIS — R634 Abnormal weight loss: Secondary | ICD-10-CM

## 2022-10-04 DIAGNOSIS — J439 Emphysema, unspecified: Secondary | ICD-10-CM | POA: Diagnosis not present

## 2022-10-14 ENCOUNTER — Other Ambulatory Visit (HOSPITAL_COMMUNITY): Payer: Self-pay | Admitting: Internal Medicine

## 2022-10-14 DIAGNOSIS — R110 Nausea: Secondary | ICD-10-CM | POA: Diagnosis not present

## 2022-10-14 DIAGNOSIS — R6882 Decreased libido: Secondary | ICD-10-CM | POA: Diagnosis not present

## 2022-10-14 DIAGNOSIS — R634 Abnormal weight loss: Secondary | ICD-10-CM | POA: Diagnosis not present

## 2022-10-14 DIAGNOSIS — E038 Other specified hypothyroidism: Secondary | ICD-10-CM | POA: Diagnosis not present

## 2022-10-14 DIAGNOSIS — R6889 Other general symptoms and signs: Secondary | ICD-10-CM | POA: Diagnosis not present

## 2022-10-18 DIAGNOSIS — I700 Atherosclerosis of aorta: Secondary | ICD-10-CM | POA: Diagnosis not present

## 2022-10-18 DIAGNOSIS — E038 Other specified hypothyroidism: Secondary | ICD-10-CM | POA: Diagnosis not present

## 2022-10-18 DIAGNOSIS — R918 Other nonspecific abnormal finding of lung field: Secondary | ICD-10-CM | POA: Diagnosis not present

## 2022-10-18 DIAGNOSIS — R6882 Decreased libido: Secondary | ICD-10-CM | POA: Diagnosis not present

## 2022-10-18 DIAGNOSIS — E230 Hypopituitarism: Secondary | ICD-10-CM | POA: Diagnosis not present

## 2022-10-18 DIAGNOSIS — J432 Centrilobular emphysema: Secondary | ICD-10-CM | POA: Diagnosis not present

## 2022-10-18 DIAGNOSIS — E221 Hyperprolactinemia: Secondary | ICD-10-CM | POA: Diagnosis not present

## 2022-10-22 ENCOUNTER — Ambulatory Visit (HOSPITAL_COMMUNITY): Admission: RE | Admit: 2022-10-22 | Payer: BC Managed Care – PPO | Source: Ambulatory Visit

## 2022-10-22 DIAGNOSIS — E038 Other specified hypothyroidism: Secondary | ICD-10-CM | POA: Diagnosis not present

## 2022-10-22 DIAGNOSIS — E039 Hypothyroidism, unspecified: Secondary | ICD-10-CM | POA: Diagnosis not present

## 2022-10-22 DIAGNOSIS — D352 Benign neoplasm of pituitary gland: Secondary | ICD-10-CM | POA: Diagnosis not present

## 2022-10-22 MED ORDER — GADOBUTROL 1 MMOL/ML IV SOLN
7.5000 mL | Freq: Once | INTRAVENOUS | Status: AC | PRN
  Administered 2022-10-22: 7.5 mL via INTRAVENOUS

## 2022-11-07 DIAGNOSIS — R3121 Asymptomatic microscopic hematuria: Secondary | ICD-10-CM | POA: Diagnosis not present

## 2022-11-07 DIAGNOSIS — N486 Induration penis plastica: Secondary | ICD-10-CM | POA: Diagnosis not present

## 2022-11-07 DIAGNOSIS — Z125 Encounter for screening for malignant neoplasm of prostate: Secondary | ICD-10-CM | POA: Diagnosis not present

## 2022-11-21 DIAGNOSIS — Z6827 Body mass index (BMI) 27.0-27.9, adult: Secondary | ICD-10-CM | POA: Diagnosis not present

## 2022-11-21 DIAGNOSIS — E230 Hypopituitarism: Secondary | ICD-10-CM | POA: Diagnosis not present

## 2022-11-21 DIAGNOSIS — R809 Proteinuria, unspecified: Secondary | ICD-10-CM | POA: Diagnosis not present

## 2022-11-21 DIAGNOSIS — D352 Benign neoplasm of pituitary gland: Secondary | ICD-10-CM | POA: Diagnosis not present

## 2022-11-21 DIAGNOSIS — E2749 Other adrenocortical insufficiency: Secondary | ICD-10-CM | POA: Diagnosis not present

## 2022-11-21 DIAGNOSIS — R319 Hematuria, unspecified: Secondary | ICD-10-CM | POA: Diagnosis not present

## 2022-11-21 DIAGNOSIS — E038 Other specified hypothyroidism: Secondary | ICD-10-CM | POA: Diagnosis not present

## 2022-11-29 ENCOUNTER — Other Ambulatory Visit: Payer: Self-pay | Admitting: Neurosurgery

## 2022-11-30 DIAGNOSIS — R6882 Decreased libido: Secondary | ICD-10-CM | POA: Diagnosis not present

## 2022-11-30 DIAGNOSIS — E230 Hypopituitarism: Secondary | ICD-10-CM | POA: Diagnosis not present

## 2022-11-30 DIAGNOSIS — E221 Hyperprolactinemia: Secondary | ICD-10-CM | POA: Diagnosis not present

## 2022-11-30 DIAGNOSIS — E038 Other specified hypothyroidism: Secondary | ICD-10-CM | POA: Diagnosis not present

## 2022-12-05 DIAGNOSIS — H18513 Endothelial corneal dystrophy, bilateral: Secondary | ICD-10-CM | POA: Diagnosis not present

## 2022-12-05 DIAGNOSIS — D443 Neoplasm of uncertain behavior of pituitary gland: Secondary | ICD-10-CM | POA: Diagnosis not present

## 2022-12-05 DIAGNOSIS — H524 Presbyopia: Secondary | ICD-10-CM | POA: Diagnosis not present

## 2022-12-05 DIAGNOSIS — H2513 Age-related nuclear cataract, bilateral: Secondary | ICD-10-CM | POA: Diagnosis not present

## 2022-12-12 DIAGNOSIS — J343 Hypertrophy of nasal turbinates: Secondary | ICD-10-CM | POA: Diagnosis not present

## 2022-12-12 DIAGNOSIS — J342 Deviated nasal septum: Secondary | ICD-10-CM | POA: Diagnosis not present

## 2022-12-12 DIAGNOSIS — R930 Abnormal findings on diagnostic imaging of skull and head, not elsewhere classified: Secondary | ICD-10-CM | POA: Diagnosis not present

## 2022-12-12 DIAGNOSIS — D352 Benign neoplasm of pituitary gland: Secondary | ICD-10-CM | POA: Diagnosis not present

## 2022-12-12 DIAGNOSIS — D497 Neoplasm of unspecified behavior of endocrine glands and other parts of nervous system: Secondary | ICD-10-CM | POA: Diagnosis not present

## 2023-01-25 DIAGNOSIS — H40013 Open angle with borderline findings, low risk, bilateral: Secondary | ICD-10-CM | POA: Diagnosis not present

## 2023-01-25 DIAGNOSIS — D443 Neoplasm of uncertain behavior of pituitary gland: Secondary | ICD-10-CM | POA: Diagnosis not present

## 2023-01-26 ENCOUNTER — Other Ambulatory Visit: Payer: Self-pay | Admitting: Otolaryngology

## 2023-02-21 NOTE — Progress Notes (Addendum)
Surgical Instructions    Your procedure is scheduled on Friday, December 13th   Report to [facility] Main Entrance "A" at 0530 A.M., then check in with the Admitting office.  Call this number if you have problems the morning of surgery:  [phone]  If you have any questions prior to your surgery date call [phone]: Open Monday-Friday 8am-4pm If you experience any cold or flu symptoms such as cough, fever, chills, shortness of breath, etc. between now and your scheduled surgery, please notify us at the above number.     Remember:  Do not eat after midnight the night before your surgery    Take these medicines the morning of surgery with A SIP OF WATER:             levothyroxine (SYNTHROID)              hydrocortisone (CORTEF)              rosuvastatin (CRESTOR)              sodium chloride (MURO 128) eye drops as needed   As of today, STOP taking any Aspirin (unless otherwise instructed by your surgeon) Aleve, Naproxen, Ibuprofen, Motrin, Advil, Goody's, BC's, all herbal medications, fish oil, and all vitamins including celecoxib (CELEBREX) .                     Do NOT Smoke (Tobacco/Vaping) for 24 hours prior to your procedure.  If you use a CPAP at night, you may bring your mask/headgear for your overnight stay.   Contacts, glasses, piercing's, hearing aid's, dentures or partials may not be worn into surgery, please bring cases for these belongings.    For patients admitted to the hospital, discharge time will be determined by your treatment team.   Patients discharged the day of surgery will not be allowed to drive home, and someone needs to stay with them for 24 hours.  SURGICAL WAITING ROOM VISITATION Patients having surgery or a procedure may have no more than 2 support people in the waiting area - these visitors may rotate.   Children under the age of 35 must have an adult with them who is not the patient. If the patient needs to stay at the hospital during part of  their recovery, the visitor guidelines for inpatient rooms apply. Pre-op nurse will coordinate an appropriate time for 1 support person to accompany patient in pre-op.  This support person may not rotate.   Please refer to the [facility] website for the visitor guidelines for Inpatients (after your surgery is over and you are in a regular room).    Special instructions:   [place]- Preparing For Surgery  Before surgery, you can play an important role. Because skin is not sterile, your skin needs to be as free of germs as possible. You can reduce the number of germs on your skin by washing with CHG (chlorahexidine gluconate) Soap before surgery.  CHG is an antiseptic cleaner which kills germs and bonds with the skin to continue killing germs even after washing.    Oral Hygiene is also important to reduce your risk of infection.  Remember - BRUSH YOUR TEETH THE MORNING OF SURGERY WITH YOUR REGULAR TOOTHPASTE  Please do not use if you have an allergy to CHG or antibacterial soaps. If your skin becomes reddened/irritated stop using the CHG.  Do not shave (including legs and underarms) for at least 48 hours prior to first CHG shower.  It is OK to shave your face.  Please follow these instructions carefully.   Shower the NIGHT BEFORE SURGERY and the MORNING OF SURGERY  If you chose to wash your hair, wash your hair first as usual with your normal shampoo.  After you shampoo, rinse your hair and body thoroughly to remove the shampoo.  Use CHG Soap as you would any other liquid soap. You can apply CHG directly to the skin and wash gently with a scrungie or a clean washcloth.   Apply the CHG Soap to your body ONLY FROM THE NECK DOWN.  Do not use on open wounds or open sores. Avoid contact with your eyes, ears, mouth and genitals (private parts). Wash Face and genitals (private parts)  with your normal soap.   Wash thoroughly, paying special attention to the area where your surgery will be  performed.  Thoroughly rinse your body with warm water from the neck down.  DO NOT shower/wash with your normal soap after using and rinsing off the CHG Soap.  Pat yourself dry with a CLEAN TOWEL.  Wear CLEAN PAJAMAS to bed the night before surgery  Place CLEAN SHEETS on your bed the night before your surgery  DO NOT SLEEP WITH PETS.   Day of Surgery: Take a shower with CHG soap. Do not wear jewelry or makeup Do not wear lotions, powders, perfumes/colognes, or deodorant. Do not shave 48 hours prior to surgery.  Men may shave face and neck. Do not bring valuables to the hospital.  [facility] is not responsible for any belongings or valuables. Do not wear nail polish, gel polish, artificial nails, or any other type of covering on natural nails (fingers and toes) If you have artificial nails or gel coating that need to be removed by a nail salon, please have this removed prior to surgery. Artificial nails or gel coating may interfere with anesthesia's ability to adequately monitor your vital signs. Wear Clean/Comfortable clothing the morning of surgery Remember to brush your teeth WITH YOUR REGULAR TOOTHPASTE.   Please read over the following fact sheets that you were given.    If you received a COVID test during your pre-op visit  it is requested that you wear a mask when out in public, stay away from anyone that may not be feeling well and notify your surgeon if you develop symptoms. If you have been in contact with anyone that has tested positive in the last 10 days please notify you surgeon.

## 2023-02-22 ENCOUNTER — Other Ambulatory Visit: Payer: Self-pay

## 2023-02-22 ENCOUNTER — Encounter (HOSPITAL_COMMUNITY)
Admission: RE | Admit: 2023-02-22 | Discharge: 2023-02-22 | Disposition: A | Discharge status: Home / Self Care | Payer: BC Managed Care – PPO | Source: Ambulatory Visit | Attending: Neurosurgery | Admitting: Neurosurgery

## 2023-02-22 ENCOUNTER — Encounter (HOSPITAL_COMMUNITY): Payer: Self-pay

## 2023-02-22 VITALS — BP 112/81 | HR 68 | Temp 98.4°F | Resp 18 | Ht 71.0 in | Wt 199.7 lb

## 2023-02-22 DIAGNOSIS — Z96612 Presence of left artificial shoulder joint: Secondary | ICD-10-CM | POA: Diagnosis not present

## 2023-02-22 DIAGNOSIS — E785 Hyperlipidemia, unspecified: Secondary | ICD-10-CM | POA: Diagnosis not present

## 2023-02-22 DIAGNOSIS — Z01812 Encounter for preprocedural laboratory examination: Secondary | ICD-10-CM | POA: Insufficient documentation

## 2023-02-22 DIAGNOSIS — Z79890 Hormone replacement therapy (postmenopausal): Secondary | ICD-10-CM | POA: Diagnosis not present

## 2023-02-22 DIAGNOSIS — Z79899 Other long term (current) drug therapy: Secondary | ICD-10-CM | POA: Diagnosis not present

## 2023-02-22 DIAGNOSIS — I77819 Aortic ectasia, unspecified site: Secondary | ICD-10-CM | POA: Insufficient documentation

## 2023-02-22 DIAGNOSIS — J432 Centrilobular emphysema: Secondary | ICD-10-CM | POA: Diagnosis not present

## 2023-02-22 DIAGNOSIS — Z01818 Encounter for other preprocedural examination: Secondary | ICD-10-CM

## 2023-02-22 DIAGNOSIS — E230 Hypopituitarism: Secondary | ICD-10-CM | POA: Diagnosis not present

## 2023-02-22 DIAGNOSIS — D352 Benign neoplasm of pituitary gland: Secondary | ICD-10-CM | POA: Diagnosis not present

## 2023-02-22 DIAGNOSIS — E2749 Other adrenocortical insufficiency: Secondary | ICD-10-CM | POA: Diagnosis not present

## 2023-02-22 DIAGNOSIS — R040 Epistaxis: Secondary | ICD-10-CM | POA: Diagnosis not present

## 2023-02-22 DIAGNOSIS — I10 Essential (primary) hypertension: Secondary | ICD-10-CM | POA: Diagnosis not present

## 2023-02-22 DIAGNOSIS — F419 Anxiety disorder, unspecified: Secondary | ICD-10-CM | POA: Diagnosis not present

## 2023-02-22 DIAGNOSIS — J342 Deviated nasal septum: Secondary | ICD-10-CM | POA: Diagnosis not present

## 2023-02-22 DIAGNOSIS — E039 Hypothyroidism, unspecified: Secondary | ICD-10-CM | POA: Diagnosis not present

## 2023-02-22 DIAGNOSIS — I4510 Unspecified right bundle-branch block: Secondary | ICD-10-CM | POA: Diagnosis not present

## 2023-02-22 DIAGNOSIS — J343 Hypertrophy of nasal turbinates: Secondary | ICD-10-CM | POA: Diagnosis not present

## 2023-02-22 HISTORY — DX: Anxiety disorder, unspecified: F41.9

## 2023-02-22 HISTORY — DX: Centrilobular emphysema: J43.2

## 2023-02-22 HISTORY — DX: Endothelial corneal dystrophy, right eye: H18.511

## 2023-02-22 HISTORY — DX: Aortic ectasia, unspecified site: I77.819

## 2023-02-22 HISTORY — DX: Induration penis plastica: N48.6

## 2023-02-22 HISTORY — DX: Other nonspecific abnormal finding of lung field: R91.8

## 2023-02-22 HISTORY — DX: Atherosclerosis of aorta: I70.0

## 2023-02-22 LAB — CBC
HCT: 43.3 % (ref 39.0–52.0)
Hemoglobin: 14.7 g/dL (ref 13.0–17.0)
MCH: 28.4 pg (ref 26.0–34.0)
MCHC: 33.9 g/dL (ref 30.0–36.0)
MCV: 83.6 fL (ref 80.0–100.0)
Platelets: 229 10*3/uL (ref 150–400)
RBC: 5.18 MIL/uL (ref 4.22–5.81)
RDW: 12.1 % (ref 11.5–15.5)
WBC: 4.8 10*3/uL (ref 4.0–10.5)
nRBC: 0 % (ref 0.0–0.2)

## 2023-02-22 LAB — BASIC METABOLIC PANEL
Anion gap: 7 (ref 5–15)
BUN: 21 mg/dL (ref 8–23)
CO2: 25 mmol/L (ref 22–32)
Calcium: 9.8 mg/dL (ref 8.9–10.3)
Chloride: 107 mmol/L (ref 98–111)
Creatinine, Ser: 0.61 mg/dL (ref 0.61–1.24)
GFR, Estimated: >60 mL/min (ref >60)
Glucose, Bld: 94 mg/dL (ref 70–99)
Potassium: 4.7 mmol/L (ref 3.5–5.1)
Sodium: 139 mmol/L (ref 135–145)

## 2023-02-22 LAB — TYPE AND SCREEN
ABO/RH(D): O POS
Antibody Screen: NEGATIVE

## 2023-02-22 NOTE — Progress Notes (Signed)
Surgical Instructions    Your procedure is scheduled on Friday, December 13th   Report to [facility] Main Entrance "A" at 0530 A.M., then check in with the Admitting office.  Call this number if you have problems the morning of surgery:  [phone]  If you have any questions prior to your surgery date call [phone]: Open Monday-Friday 8am-4pm If you experience any cold or flu symptoms such as cough, fever, chills, shortness of breath, etc. between now and your scheduled surgery, please notify us at the above number.     Remember:  Do not eat or drink after midnight the night before your surgery    Take these medicines the morning of surgery with A SIP OF WATER:             levothyroxine (SYNTHROID)              hydrocortisone (CORTEF)              rosuvastatin (CRESTOR)              sodium chloride (MURO 128) eye drops as needed   As of today, STOP taking any Aspirin (unless otherwise instructed by your surgeon) Aleve, Naproxen, Ibuprofen, Motrin, Advil, Goody's, BC's, all herbal medications, fish oil, and all vitamins including celecoxib (CELEBREX) .                     Do NOT Smoke (Tobacco/Vaping) for 24 hours prior to your procedure.  If you use a CPAP at night, you may bring your mask/headgear for your overnight stay.   Contacts, glasses, piercing's, hearing aid's, dentures or partials may not be worn into surgery, please bring cases for these belongings.    For patients admitted to the hospital, discharge time will be determined by your treatment team.   Patients discharged the day of surgery will not be allowed to drive home, and someone needs to stay with them for 24 hours.  SURGICAL WAITING ROOM VISITATION Patients having surgery or a procedure may have no more than 2 support people in the waiting area - these visitors may rotate.   Children under the age of 37 must have an adult with them who is not the patient. If the patient needs to stay at the hospital  during part of their recovery, the visitor guidelines for inpatient rooms apply. Pre-op nurse will coordinate an appropriate time for 1 support person to accompany patient in pre-op.  This support person may not rotate.   Please refer to the [facility] website for the visitor guidelines for Inpatients (after your surgery is over and you are in a regular room).    Special instructions:   [place]- Preparing For Surgery  Before surgery, you can play an important role. Because skin is not sterile, your skin needs to be as free of germs as possible. You can reduce the number of germs on your skin by washing with CHG (chlorahexidine gluconate) Soap before surgery.  CHG is an antiseptic cleaner which kills germs and bonds with the skin to continue killing germs even after washing.    Oral Hygiene is also important to reduce your risk of infection.  Remember - BRUSH YOUR TEETH THE MORNING OF SURGERY WITH YOUR REGULAR TOOTHPASTE  Please do not use if you have an allergy to CHG or antibacterial soaps. If your skin becomes reddened/irritated stop using the CHG.  Do not shave (including legs and underarms) for at least 48 hours prior to first  CHG shower. It is OK to shave your face.  Please follow these instructions carefully.   Shower the NIGHT BEFORE SURGERY and the MORNING OF SURGERY  If you chose to wash your hair, wash your hair first as usual with your normal shampoo.  After you shampoo, rinse your hair and body thoroughly to remove the shampoo.  Use CHG Soap as you would any other liquid soap. You can apply CHG directly to the skin and wash gently with a scrungie or a clean washcloth.   Apply the CHG Soap to your body ONLY FROM THE NECK DOWN.  Do not use on open wounds or open sores. Avoid contact with your eyes, ears, mouth and genitals (private parts). Wash Face and genitals (private parts)  with your normal soap.   Wash thoroughly, paying special attention to the area where your surgery  will be performed.  Thoroughly rinse your body with warm water from the neck down.  DO NOT shower/wash with your normal soap after using and rinsing off the CHG Soap.  Pat yourself dry with a CLEAN TOWEL.  Wear CLEAN PAJAMAS to bed the night before surgery  Place CLEAN SHEETS on your bed the night before your surgery  DO NOT SLEEP WITH PETS.   Day of Surgery: Take a shower with CHG soap. Do not wear jewelry or makeup Do not wear lotions, powders, perfumes/colognes, or deodorant. Do not shave 48 hours prior to surgery.  Men may shave face and neck. Do not bring valuables to the hospital.  [facility] is not responsible for any belongings or valuables. Do not wear nail polish, gel polish, artificial nails, or any other type of covering on natural nails (fingers and toes) If you have artificial nails or gel coating that need to be removed by a nail salon, please have this removed prior to surgery. Artificial nails or gel coating may interfere with anesthesia's ability to adequately monitor your vital signs. Wear Clean/Comfortable clothing the morning of surgery Remember to brush your teeth WITH YOUR REGULAR TOOTHPASTE.   Please read over the following fact sheets that you were given.    If you received a COVID test during your pre-op visit  it is requested that you wear a mask when out in public, stay away from anyone that may not be feeling well and notify your surgeon if you develop symptoms. If you have been in contact with anyone that has tested positive in the last 10 days please notify you surgeon.

## 2023-02-22 NOTE — Progress Notes (Signed)
PCP - Tera Helper, PA Cardiologist - denies Endocrinologist- Dr. Talmage Coin  PPM/ICD - denies   Chest x-ray - 10+ years ago per pt, normal, with [facility] EKG - 05/02/22- tracing req. Stress Test - denies ECHO - denies Cardiac Cath - denies  Sleep Study - denies   DM- denies  Last dose of GLP1 agonist-  n/a   ASA/Blood Thinner Instructions: n/a   ERAS Protcol - no, NPO   COVID TEST- n/a   Anesthesia review: yes, EKG tracing requested  Patient denies shortness of breath, fever, cough and chest pain at PAT appointment   All instructions explained to the patient, with a verbal understanding of the material. Patient agrees to go over the instructions while at home for a better understanding.  The opportunity to ask questions was provided.

## 2023-02-24 ENCOUNTER — Encounter (HOSPITAL_COMMUNITY): Payer: Self-pay | Admitting: Neurosurgery

## 2023-02-24 ENCOUNTER — Other Ambulatory Visit: Payer: Self-pay

## 2023-02-24 ENCOUNTER — Inpatient Hospital Stay (HOSPITAL_COMMUNITY): Payer: BC Managed Care – PPO

## 2023-02-24 ENCOUNTER — Encounter (HOSPITAL_COMMUNITY)
Admission: RE | Disposition: A | Discharge status: Home / Self Care | Payer: Self-pay | Source: Home / Self Care | Attending: Neurosurgery

## 2023-02-24 ENCOUNTER — Inpatient Hospital Stay (HOSPITAL_COMMUNITY)
Admission: RE | Admit: 2023-02-24 | Discharge: 2023-02-26 | DRG: 614 | Disposition: A | Discharge status: Home / Self Care | Payer: BC Managed Care – PPO | Attending: Neurosurgery | Admitting: Neurosurgery

## 2023-02-24 DIAGNOSIS — R040 Epistaxis: Secondary | ICD-10-CM | POA: Diagnosis not present

## 2023-02-24 DIAGNOSIS — I4510 Unspecified right bundle-branch block: Secondary | ICD-10-CM | POA: Diagnosis present

## 2023-02-24 DIAGNOSIS — E2749 Other adrenocortical insufficiency: Secondary | ICD-10-CM | POA: Diagnosis present

## 2023-02-24 DIAGNOSIS — Z79899 Other long term (current) drug therapy: Secondary | ICD-10-CM

## 2023-02-24 DIAGNOSIS — D352 Benign neoplasm of pituitary gland: Principal | ICD-10-CM | POA: Diagnosis present

## 2023-02-24 DIAGNOSIS — J432 Centrilobular emphysema: Secondary | ICD-10-CM | POA: Diagnosis present

## 2023-02-24 DIAGNOSIS — E785 Hyperlipidemia, unspecified: Secondary | ICD-10-CM | POA: Diagnosis present

## 2023-02-24 DIAGNOSIS — F419 Anxiety disorder, unspecified: Secondary | ICD-10-CM | POA: Diagnosis not present

## 2023-02-24 DIAGNOSIS — J342 Deviated nasal septum: Secondary | ICD-10-CM | POA: Diagnosis not present

## 2023-02-24 DIAGNOSIS — Z79890 Hormone replacement therapy (postmenopausal): Secondary | ICD-10-CM | POA: Diagnosis not present

## 2023-02-24 DIAGNOSIS — E230 Hypopituitarism: Secondary | ICD-10-CM | POA: Diagnosis present

## 2023-02-24 DIAGNOSIS — I10 Essential (primary) hypertension: Secondary | ICD-10-CM | POA: Diagnosis present

## 2023-02-24 DIAGNOSIS — E039 Hypothyroidism, unspecified: Secondary | ICD-10-CM | POA: Diagnosis present

## 2023-02-24 DIAGNOSIS — Z96612 Presence of left artificial shoulder joint: Secondary | ICD-10-CM | POA: Diagnosis present

## 2023-02-24 DIAGNOSIS — J343 Hypertrophy of nasal turbinates: Secondary | ICD-10-CM | POA: Diagnosis not present

## 2023-02-24 HISTORY — PX: TURBINATE REDUCTION: SHX6157

## 2023-02-24 HISTORY — PX: CRANIOTOMY: SHX93

## 2023-02-24 HISTORY — PX: TRANSPHENOIDAL APPROACH EXPOSURE: SHX6311

## 2023-02-24 HISTORY — PX: SEPTOPLASTY: SHX2393

## 2023-02-24 LAB — ABO/RH: ABO/RH(D): O POS

## 2023-02-24 SURGERY — CRANIOTOMY HYPOPHYSECTOMY TRANSNASAL APPROACH
Anesthesia: General

## 2023-02-24 MED ORDER — MELATONIN 3 MG PO TABS
3.0000 mg | ORAL_TABLET | Freq: Every evening | ORAL | Status: DC | PRN
  Administered 2023-02-25: 3 mg via ORAL
  Filled 2023-02-24: qty 1

## 2023-02-24 MED ORDER — ADHERUS DURAL SEALANT
PACK | TOPICAL | Status: DC | PRN
  Administered 2023-02-24: 1

## 2023-02-24 MED ORDER — ROSUVASTATIN CALCIUM 5 MG PO TABS
10.0000 mg | ORAL_TABLET | Freq: Every day | ORAL | Status: DC
  Administered 2023-02-25 – 2023-02-26 (×2): 10 mg via ORAL
  Filled 2023-02-24 (×2): qty 2

## 2023-02-24 MED ORDER — ACETAMINOPHEN 650 MG RE SUPP: 650.0000 mg | RECTAL | Status: DC | PRN

## 2023-02-24 MED ORDER — OXYCODONE HCL 5 MG PO TABS: 5.0000 mg | ORAL_TABLET | Freq: Once | ORAL | Status: DC | PRN

## 2023-02-24 MED ORDER — PROPOFOL 10 MG/ML IV BOLUS
INTRAVENOUS | Status: AC
  Filled 2023-02-24: qty 20

## 2023-02-24 MED ORDER — FENTANYL CITRATE (PF) 100 MCG/2ML IJ SOLN
INTRAMUSCULAR | Status: AC
  Filled 2023-02-24: qty 2

## 2023-02-24 MED ORDER — 0.9 % SODIUM CHLORIDE (POUR BTL) OPTIME
TOPICAL | Status: DC | PRN
  Administered 2023-02-24: 1000 mL

## 2023-02-24 MED ORDER — FENTANYL CITRATE (PF) 100 MCG/2ML IJ SOLN
25.0000 ug | INTRAMUSCULAR | Status: DC | PRN
  Administered 2023-02-24: 25 ug via INTRAVENOUS

## 2023-02-24 MED ORDER — HYDROCODONE-ACETAMINOPHEN 5-325 MG PO TABS
1.0000 | ORAL_TABLET | ORAL | Status: DC | PRN
  Administered 2023-02-24 – 2023-02-26 (×2): 1 via ORAL
  Filled 2023-02-24 (×2): qty 1

## 2023-02-24 MED ORDER — LIDOCAINE-EPINEPHRINE 1 %-1:100000 IJ SOLN
INTRAMUSCULAR | Status: DC | PRN
  Administered 2023-02-24: 13.5 mL

## 2023-02-24 MED ORDER — BACITRACIN ZINC 500 UNIT/GM EX OINT
TOPICAL_OINTMENT | CUTANEOUS | Status: AC
  Filled 2023-02-24: qty 28.35

## 2023-02-24 MED ORDER — SENNOSIDES-DOCUSATE SODIUM 8.6-50 MG PO TABS: 1.0000 | ORAL_TABLET | Freq: Every evening | ORAL | Status: DC | PRN

## 2023-02-24 MED ORDER — BACITRACIN ZINC 500 UNIT/GM EX OINT
TOPICAL_OINTMENT | CUTANEOUS | Status: DC | PRN
  Administered 2023-02-24: 1 via TOPICAL

## 2023-02-24 MED ORDER — DEXAMETHASONE SODIUM PHOSPHATE 10 MG/ML IJ SOLN
INTRAMUSCULAR | Status: AC
  Filled 2023-02-24: qty 1

## 2023-02-24 MED ORDER — SODIUM CHLORIDE 0.9 % IV SOLN: INTRAVENOUS | Status: DC | PRN

## 2023-02-24 MED ORDER — ROCURONIUM BROMIDE 10 MG/ML (PF) SYRINGE
PREFILLED_SYRINGE | INTRAVENOUS | Status: DC | PRN
  Administered 2023-02-24 (×2): 20 mg via INTRAVENOUS
  Administered 2023-02-24: 100 mg via INTRAVENOUS
  Administered 2023-02-24: 20 mg via INTRAVENOUS

## 2023-02-24 MED ORDER — OMEGA-3-ACID ETHYL ESTERS 1 G PO CAPS
2.0000 g | ORAL_CAPSULE | Freq: Every day | ORAL | Status: DC
  Administered 2023-02-24 – 2023-02-26 (×3): 2 g via ORAL
  Filled 2023-02-24 (×3): qty 2

## 2023-02-24 MED ORDER — PANTOPRAZOLE SODIUM 40 MG PO TBEC
40.0000 mg | DELAYED_RELEASE_TABLET | Freq: Every day | ORAL | Status: DC
  Administered 2023-02-24 – 2023-02-26 (×3): 40 mg via ORAL
  Filled 2023-02-24 (×3): qty 1

## 2023-02-24 MED ORDER — PROPOFOL 500 MG/50ML IV EMUL
INTRAVENOUS | Status: DC | PRN
  Administered 2023-02-24: 50 ug/kg/min via INTRAVENOUS

## 2023-02-24 MED ORDER — SUGAMMADEX SODIUM 200 MG/2ML IV SOLN
INTRAVENOUS | Status: DC | PRN
  Administered 2023-02-24: 200 mg via INTRAVENOUS

## 2023-02-24 MED ORDER — EPINEPHRINE HCL (NASAL) 0.1 % NA SOLN
NASAL | Status: DC | PRN
  Administered 2023-02-24: 20 mL via NASAL

## 2023-02-24 MED ORDER — ACETAMINOPHEN 10 MG/ML IV SOLN: 1000.0000 mg | Freq: Once | INTRAVENOUS | Status: DC | PRN

## 2023-02-24 MED ORDER — HYDROCORTISONE 10 MG PO TABS
10.0000 mg | ORAL_TABLET | Freq: Every day | ORAL | Status: DC
  Administered 2023-02-25 – 2023-02-26 (×2): 10 mg via ORAL
  Filled 2023-02-24 (×2): qty 1

## 2023-02-24 MED ORDER — LEVOTHYROXINE SODIUM 50 MCG PO TABS
50.0000 ug | ORAL_TABLET | Freq: Every day | ORAL | Status: DC
  Administered 2023-02-25 – 2023-02-26 (×2): 50 ug via ORAL
  Filled 2023-02-24 (×2): qty 1

## 2023-02-24 MED ORDER — ROCURONIUM BROMIDE 10 MG/ML (PF) SYRINGE
PREFILLED_SYRINGE | INTRAVENOUS | Status: AC
  Filled 2023-02-24: qty 10

## 2023-02-24 MED ORDER — PROPOFOL 10 MG/ML IV BOLUS
INTRAVENOUS | Status: DC | PRN
  Administered 2023-02-24: 50 mg via INTRAVENOUS
  Administered 2023-02-24: 100 mg via INTRAVENOUS
  Administered 2023-02-24: 150 mg via INTRAVENOUS

## 2023-02-24 MED ORDER — THROMBIN 5000 UNITS EX SOLR: OROMUCOSAL | Status: DC | PRN

## 2023-02-24 MED ORDER — ONDANSETRON HCL 4 MG/2ML IJ SOLN
INTRAMUSCULAR | Status: DC | PRN
  Administered 2023-02-24 (×2): 4 mg via INTRAVENOUS

## 2023-02-24 MED ORDER — ONDANSETRON HCL 4 MG PO TABS: 4.0000 mg | ORAL_TABLET | ORAL | Status: DC | PRN

## 2023-02-24 MED ORDER — FENTANYL CITRATE (PF) 250 MCG/5ML IJ SOLN
INTRAMUSCULAR | Status: AC
  Filled 2023-02-24: qty 5

## 2023-02-24 MED ORDER — LACTATED RINGERS IV SOLN: INTRAVENOUS | Status: DC

## 2023-02-24 MED ORDER — ADULT MULTIVITAMIN W/MINERALS CH
1.0000 | ORAL_TABLET | Freq: Every day | ORAL | Status: DC
  Administered 2023-02-24 – 2023-02-26 (×3): 1 via ORAL
  Filled 2023-02-24 (×3): qty 1

## 2023-02-24 MED ORDER — BISACODYL 10 MG RE SUPP: 10.0000 mg | Freq: Every day | RECTAL | Status: DC | PRN

## 2023-02-24 MED ORDER — ONDANSETRON HCL 4 MG/2ML IJ SOLN: 4.0000 mg | INTRAMUSCULAR | Status: DC | PRN

## 2023-02-24 MED ORDER — CHLORHEXIDINE GLUCONATE CLOTH 2 % EX PADS
6.0000 | MEDICATED_PAD | Freq: Once | CUTANEOUS | Status: DC
Start: 2023-02-24 — End: 2023-02-24

## 2023-02-24 MED ORDER — SODIUM CHLORIDE 0.9 % IV SOLN
0.1500 ug/kg/min | INTRAVENOUS | Status: AC
  Administered 2023-02-24: .05 ug/kg/min via INTRAVENOUS
  Filled 2023-02-24: qty 2000

## 2023-02-24 MED ORDER — ORAL CARE MOUTH RINSE
15.0000 mL | OROMUCOSAL | Status: DC | PRN
Start: 2023-02-24 — End: 2023-02-26

## 2023-02-24 MED ORDER — PHENYLEPHRINE 80 MCG/ML (10ML) SYRINGE FOR IV PUSH (FOR BLOOD PRESSURE SUPPORT)
PREFILLED_SYRINGE | INTRAVENOUS | Status: DC | PRN
  Administered 2023-02-24 (×3): 80 ug via INTRAVENOUS

## 2023-02-24 MED ORDER — CHLORHEXIDINE GLUCONATE CLOTH 2 % EX PADS: 6.0000 | MEDICATED_PAD | Freq: Once | CUTANEOUS | Status: DC

## 2023-02-24 MED ORDER — MIDAZOLAM HCL 2 MG/2ML IJ SOLN
INTRAMUSCULAR | Status: DC | PRN
  Administered 2023-02-24 (×2): 1 mg via INTRAVENOUS

## 2023-02-24 MED ORDER — LABETALOL HCL 5 MG/ML IV SOLN
10.0000 mg | INTRAVENOUS | Status: DC | PRN
  Administered 2023-02-24 (×2): 10 mg via INTRAVENOUS
  Administered 2023-02-24: 20 mg via INTRAVENOUS
  Filled 2023-02-24: qty 4

## 2023-02-24 MED ORDER — THROMBIN 5000 UNITS EX SOLR
CUTANEOUS | Status: AC
  Filled 2023-02-24: qty 5000

## 2023-02-24 MED ORDER — EPINEPHRINE HCL (NASAL) 0.1 % NA SOLN
NASAL | Status: AC
  Filled 2023-02-24: qty 90

## 2023-02-24 MED ORDER — ONDANSETRON HCL 4 MG/2ML IJ SOLN
INTRAMUSCULAR | Status: AC
  Filled 2023-02-24: qty 2

## 2023-02-24 MED ORDER — ONDANSETRON HCL 4 MG/2ML IJ SOLN: 4.0000 mg | Freq: Once | INTRAMUSCULAR | Status: DC | PRN

## 2023-02-24 MED ORDER — CHLORHEXIDINE GLUCONATE CLOTH 2 % EX PADS
6.0000 | MEDICATED_PAD | Freq: Every day | CUTANEOUS | Status: DC
  Administered 2023-02-24 – 2023-02-26 (×3): 6 via TOPICAL

## 2023-02-24 MED ORDER — PANTOPRAZOLE SODIUM 40 MG IV SOLR
40.0000 mg | Freq: Every day | INTRAVENOUS | Status: DC
Start: 2023-02-24 — End: 2023-02-24

## 2023-02-24 MED ORDER — LIDOCAINE 2% (20 MG/ML) 5 ML SYRINGE
INTRAMUSCULAR | Status: DC | PRN
  Administered 2023-02-24: 100 mg via INTRAVENOUS

## 2023-02-24 MED ORDER — ORAL CARE MOUTH RINSE: 15.0000 mL | Freq: Once | OROMUCOSAL | Status: AC

## 2023-02-24 MED ORDER — DEXAMETHASONE SODIUM PHOSPHATE 10 MG/ML IJ SOLN
INTRAMUSCULAR | Status: DC | PRN
  Administered 2023-02-24: 10 mg via INTRAVENOUS

## 2023-02-24 MED ORDER — CEFAZOLIN SODIUM-DEXTROSE 2-4 GM/100ML-% IV SOLN
2.0000 g | Freq: Three times a day (TID) | INTRAVENOUS | Status: AC
  Administered 2023-02-24 (×2): 2 g via INTRAVENOUS
  Filled 2023-02-24 (×2): qty 100

## 2023-02-24 MED ORDER — CHLORHEXIDINE GLUCONATE 0.12 % MT SOLN
15.0000 mL | Freq: Once | OROMUCOSAL | Status: AC
Start: 2023-02-24 — End: 2023-02-24
  Administered 2023-02-24: 15 mL via OROMUCOSAL
  Filled 2023-02-24: qty 15

## 2023-02-24 MED ORDER — LABETALOL HCL 5 MG/ML IV SOLN
INTRAVENOUS | Status: AC
  Filled 2023-02-24: qty 4

## 2023-02-24 MED ORDER — ESMOLOL HCL 100 MG/10ML IV SOLN
INTRAVENOUS | Status: AC
  Filled 2023-02-24: qty 10

## 2023-02-24 MED ORDER — MIDAZOLAM HCL 2 MG/2ML IJ SOLN
INTRAMUSCULAR | Status: AC
  Filled 2023-02-24: qty 2

## 2023-02-24 MED ORDER — FENTANYL CITRATE (PF) 250 MCG/5ML IJ SOLN
INTRAMUSCULAR | Status: DC | PRN
  Administered 2023-02-24: 100 ug via INTRAVENOUS

## 2023-02-24 MED ORDER — THROMBIN 20000 UNITS EX SOLR
CUTANEOUS | Status: AC
  Filled 2023-02-24: qty 20000

## 2023-02-24 MED ORDER — CEFAZOLIN SODIUM-DEXTROSE 2-4 GM/100ML-% IV SOLN
2.0000 g | INTRAVENOUS | Status: AC
  Administered 2023-02-24: 2 g via INTRAVENOUS
  Filled 2023-02-24: qty 100

## 2023-02-24 MED ORDER — ACETAMINOPHEN 325 MG PO TABS
650.0000 mg | ORAL_TABLET | ORAL | Status: DC | PRN
  Administered 2023-02-24 – 2023-02-26 (×8): 650 mg via ORAL
  Filled 2023-02-24 (×8): qty 2

## 2023-02-24 MED ORDER — OXYCODONE HCL 5 MG/5ML PO SOLN: 5.0000 mg | Freq: Once | ORAL | Status: DC | PRN

## 2023-02-24 MED ORDER — SALINE SPRAY 0.65 % NA SOLN
2.0000 | NASAL | Status: DC
  Administered 2023-02-24 – 2023-02-26 (×11): 2 via NASAL
  Filled 2023-02-24 (×2): qty 44

## 2023-02-24 MED ORDER — LIDOCAINE 2% (20 MG/ML) 5 ML SYRINGE
INTRAMUSCULAR | Status: AC
  Filled 2023-02-24: qty 5

## 2023-02-24 MED ORDER — SODIUM CHLORIDE 0.9 % IR SOLN
Status: DC | PRN
  Administered 2023-02-24 (×2): 1000 mL

## 2023-02-24 MED ORDER — THROMBIN 20000 UNITS EX SOLR: CUTANEOUS | Status: DC | PRN

## 2023-02-24 MED ORDER — LIDOCAINE-EPINEPHRINE 1 %-1:100000 IJ SOLN
INTRAMUSCULAR | Status: AC
  Filled 2023-02-24: qty 1

## 2023-02-24 MED ORDER — OXYMETAZOLINE HCL 0.05 % NA SOLN
1.0000 | Freq: Two times a day (BID) | NASAL | Status: DC | PRN
Start: 2023-02-24 — End: 2023-02-27

## 2023-02-24 MED ORDER — PHENYLEPHRINE HCL-NACL 20-0.9 MG/250ML-% IV SOLN
INTRAVENOUS | Status: DC | PRN
  Administered 2023-02-24: 30 ug/min via INTRAVENOUS

## 2023-02-24 MED ORDER — CLEVIDIPINE BUTYRATE 0.5 MG/ML IV EMUL
INTRAVENOUS | Status: AC
  Filled 2023-02-24: qty 50

## 2023-02-24 MED ORDER — SODIUM CHLORIDE (HYPERTONIC) 5 % OP SOLN: 1.0000 [drp] | Freq: Four times a day (QID) | OPHTHALMIC | Status: DC | PRN

## 2023-02-24 MED ORDER — BUPIVACAINE HCL (PF) 0.5 % IJ SOLN
INTRAMUSCULAR | Status: AC
  Filled 2023-02-24: qty 30

## 2023-02-24 MED ORDER — SODIUM CHLORIDE 0.9 % IV SOLN
0.1500 ug/kg/min | INTRAVENOUS | Status: DC
  Filled 2023-02-24: qty 2000

## 2023-02-24 SURGICAL SUPPLY — 106 items
ATTRACTOMAT 16X20 MAGNETIC DRP (DRAPES) ×2 IMPLANT
BAG COUNTER SPONGE SURGICOUNT (BAG) ×4 IMPLANT
BENZOIN TINCTURE PRP APPL 2/3 (GAUZE/BANDAGES/DRESSINGS) ×2 IMPLANT
BETADINE 5% OPHTHALMIC (OPHTHALMIC) ×4 IMPLANT
BLADE INF TURB ROT M4 2 5PK (BLADE) IMPLANT
BLADE NAVIG QUADCUT 4.3X13 M4 (BLADE) ×2 IMPLANT
BLADE RAD40 ROTATE 4M 4 5PK (BLADE) IMPLANT
BLADE SURG 11 STRL SS (BLADE) ×4 IMPLANT
BLADE SURG 15 STRL LF DISP TIS (BLADE) ×2 IMPLANT
BUR DIAMOND 13X5 70D (BURR) IMPLANT
BUR DIAMOND CURV 15X5 15D (BURR) IMPLANT
BUR TAPER CHOANAL ATRESIA 30K (BURR) ×2 IMPLANT
CANISTER SUCT 3000ML PPV (MISCELLANEOUS) ×6 IMPLANT
CNTNR URN SCR LID CUP LEK RST (MISCELLANEOUS) ×2 IMPLANT
COAGULATOR SUCT SWTCH 10FR 6 (ELECTROSURGICAL) IMPLANT
COVER SURGICAL LIGHT HANDLE (MISCELLANEOUS) ×2 IMPLANT
DEFOGGER MIRROR 1QT (MISCELLANEOUS) ×2 IMPLANT
DRAPE HALF SHEET 40X57 (DRAPES) ×4 IMPLANT
DRAPE INCISE IOBAN 66X45 STRL (DRAPES) ×2 IMPLANT
DRAPE MICROSCOPE SLANT 54X150 (MISCELLANEOUS) IMPLANT
DRESSING NASAL POPE 10X1.5X2.5 (GAUZE/BANDAGES/DRESSINGS) IMPLANT
DRSG NASAL POPE 10X1.5X2.5 (GAUZE/BANDAGES/DRESSINGS)
DRSG TEGADERM 2-3/8X2-3/4 SM (GAUZE/BANDAGES/DRESSINGS) IMPLANT
DRSG TELFA 3X8 NADH STRL (GAUZE/BANDAGES/DRESSINGS) IMPLANT
DURAPREP 26ML APPLICATOR (WOUND CARE) ×2 IMPLANT
ELECT COATED BLADE 2.86 ST (ELECTRODE) ×2 IMPLANT
ELECT NDL TIP 2.8 STRL (NEEDLE) ×2 IMPLANT
ELECT NEEDLE TIP 2.8 STRL (NEEDLE) ×2
ELECT REM PT RETURN 9FT ADLT (ELECTROSURGICAL)
ELECTRODE NDL INSULATED 6.5 (ELECTROSURGICAL) IMPLANT
ELECTRODE REM PT RTRN 9FT ADLT (ELECTROSURGICAL) ×4 IMPLANT
GAUZE 4X4 16PLY ~~LOC~~+RFID DBL (SPONGE) IMPLANT
GAUZE PACKING FOLDED 2 STR (GAUZE/BANDAGES/DRESSINGS) ×2 IMPLANT
GAUZE SPONGE 2X2 8PLY STRL LF (GAUZE/BANDAGES/DRESSINGS) ×2 IMPLANT
GAUZE SPONGE 4X4 12PLY STRL (GAUZE/BANDAGES/DRESSINGS) ×2 IMPLANT
GLOVE BIO SURGEON STRL SZ 6.5 (GLOVE) ×2 IMPLANT
GLOVE BIOGEL PI IND STRL 7.5 (GLOVE) ×2 IMPLANT
GLOVE ECLIPSE 7.0 STRL STRAW (GLOVE) ×2 IMPLANT
GLOVE EXAM NITRILE XL STR (GLOVE) IMPLANT
GOWN STRL REUS W/ TWL LRG LVL3 (GOWN DISPOSABLE) ×4 IMPLANT
GOWN STRL REUS W/ TWL XL LVL3 (GOWN DISPOSABLE) IMPLANT
GOWN STRL REUS W/TWL 2XL LVL3 (GOWN DISPOSABLE) ×2 IMPLANT
GRAFT DURAGEN MATRIX 1WX1L (Tissue) IMPLANT
HEMOSTAT ARISTA ABSORB 3G PWDR (HEMOSTASIS) IMPLANT
HEMOSTAT POWDER KIT SURGIFOAM (HEMOSTASIS) ×2 IMPLANT
HEMOSTAT SURGICEL 2X14 (HEMOSTASIS) IMPLANT
IV NS 1000ML BAXH (IV SOLUTION) ×4 IMPLANT
KIT BASIN OR (CUSTOM PROCEDURE TRAY) ×4 IMPLANT
KIT DRAIN CSF ACCUDRAIN (MISCELLANEOUS) IMPLANT
KIT TURNOVER KIT B (KITS) ×4 IMPLANT
KNIFE ARACHNOID DISP AM-21-S (BLADE) IMPLANT
MARKER SKIN DUAL TIP RULER LAB (MISCELLANEOUS) ×2 IMPLANT
NDL HYPO 25GX1X1/2 BEV (NEEDLE) ×4 IMPLANT
NDL HYPO 25X1 1.5 SAFETY (NEEDLE) ×2 IMPLANT
NDL SPNL 22GX3.5 QUINCKE BK (NEEDLE) ×2 IMPLANT
NDL SPNL 25GX3.5 QUINCKE BL (NEEDLE) ×2 IMPLANT
NEEDLE HYPO 25GX1X1/2 BEV (NEEDLE) ×4
NEEDLE HYPO 25X1 1.5 SAFETY (NEEDLE) ×2
NEEDLE SPNL 22GX3.5 QUINCKE BK (NEEDLE) ×2
NEEDLE SPNL 25GX3.5 QUINCKE BL (NEEDLE) ×2
NS IRRIG 1000ML POUR BTL (IV SOLUTION) ×4 IMPLANT
PACK LAMINECTOMY NEURO (CUSTOM PROCEDURE TRAY) ×2 IMPLANT
PAD ARMBOARD 7.5X6 YLW CONV (MISCELLANEOUS) ×6 IMPLANT
PATTIES SURGICAL .25X.25 (GAUZE/BANDAGES/DRESSINGS) IMPLANT
PATTIES SURGICAL .5 X.5 (GAUZE/BANDAGES/DRESSINGS) IMPLANT
PATTIES SURGICAL .5 X3 (DISPOSABLE) ×4 IMPLANT
PENCIL SMOKE EVACUATOR (MISCELLANEOUS) ×2 IMPLANT
POSITIONER HEAD DONUT 9IN (MISCELLANEOUS) ×2 IMPLANT
SEALANT ADHERUS EXTEND TIP (MISCELLANEOUS) IMPLANT
SHEATH ENDOSCRUB 0 DEG (SHEATH) ×2 IMPLANT
SHEATH ENDOSCRUB 30 DEG (SHEATH) IMPLANT
SHEATH ENDOSCRUB 45 DEG (SHEATH) IMPLANT
SOL ANTI FOG 6CC (MISCELLANEOUS) IMPLANT
SPECIMEN JAR SMALL (MISCELLANEOUS) IMPLANT
SPLINT NASAL AIRWAY SILICONE (MISCELLANEOUS) IMPLANT
SPLINT NASAL DOYLE BI-VL (GAUZE/BANDAGES/DRESSINGS) IMPLANT
SPLINT NASAL POSISEP X .6X2 (GAUZE/BANDAGES/DRESSINGS) IMPLANT
SPLINT NASAL POSISEP X2 .8X2.3 (GAUZE/BANDAGES/DRESSINGS) IMPLANT
SPONGE SURGIFOAM ABS GEL 12-7 (HEMOSTASIS) IMPLANT
SPONGE T-LAP 18X18 ~~LOC~~+RFID (SPONGE) IMPLANT
STAPLER SKIN PROX WIDE 3.9 (STAPLE) ×2 IMPLANT
STAPLER VISISTAT 35W (STAPLE) ×2 IMPLANT
STRIP CLOSURE SKIN 1/2X4 (GAUZE/BANDAGES/DRESSINGS) ×2 IMPLANT
SUCTION TUBE FRAZIER 10FR DISP (SUCTIONS) ×2 IMPLANT
SUT CHROMIC 4 0 SH 27 (SUTURE) IMPLANT
SUT ETHILON 3 0 FSL (SUTURE) IMPLANT
SUT ETHILON 3 0 PS 1 (SUTURE) IMPLANT
SUT ETHILON 6 0 P 1 (SUTURE) IMPLANT
SUT PDS AB 4-0 RB1 27 (SUTURE) IMPLANT
SUT PDS PLUS AB 5-0 RB-1 (SUTURE) IMPLANT
SUT PLAIN 4 0 ~~LOC~~ 1 (SUTURE) IMPLANT
SUT SILK 2 0 SH (SUTURE) IMPLANT
SUT VIC AB 4-0 P-3 18X BRD (SUTURE) IMPLANT
SYR TB 1ML LUER SLIP (SYRINGE) ×4 IMPLANT
TOWEL GREEN STERILE (TOWEL DISPOSABLE) ×2 IMPLANT
TOWEL GREEN STERILE FF (TOWEL DISPOSABLE) ×4 IMPLANT
TRACKER ENT INSTRUMENT (MISCELLANEOUS) ×4 IMPLANT
TRACKER ENT PATIENT (MISCELLANEOUS) ×2 IMPLANT
TRAP SPECIMEN MUCUS 40CC (MISCELLANEOUS) IMPLANT
TRAY ENT MC OR (CUSTOM PROCEDURE TRAY) ×2 IMPLANT
TRAY FOLEY MTR SLVR 16FR STAT (SET/KITS/TRAYS/PACK) ×2 IMPLANT
TUBE CONNECTING 12X1/4 (SUCTIONS) ×2 IMPLANT
TUBING EXTENTION W/L.L. (IV SETS) IMPLANT
TUBING FEATHERFLOW (TUBING) IMPLANT
TUBING STRAIGHTSHOT EPS 5PK (TUBING) ×2 IMPLANT
WATER STERILE IRR 1000ML POUR (IV SOLUTION) ×4 IMPLANT

## 2023-02-24 NOTE — Anesthesia Procedure Notes (Signed)
Procedure Name: Intubation Date/Time: 02/24/2023 8:15 AM  Performed by: Yolonda Kida, CRNAPre-anesthesia Checklist: Patient identified, Emergency Drugs available, Suction available and Patient being monitored Patient Re-evaluated:Patient Re-evaluated prior to induction Oxygen Delivery Method: Circle System Utilized Preoxygenation: Pre-oxygenation with 100% oxygen Induction Type: IV induction Ventilation: Mask ventilation without difficulty and Oral airway inserted - appropriate to patient size Laryngoscope Size: Mac and 4 Grade View: Grade I Tube type: Oral Tube size: 7.5 mm Number of attempts: 1 Airway Equipment and Method: Stylet and Oral airway Placement Confirmation: ETT inserted through vocal cords under direct vision, positive ETCO2 and breath sounds checked- equal and bilateral Secured at: 22 cm Tube secured with: Tape Dental Injury: Teeth and Oropharynx as per pre-operative assessment

## 2023-02-24 NOTE — Anesthesia Preprocedure Evaluation (Signed)
Anesthesia Evaluation  Patient identified by MRN, date of birth, ID band Patient awake    Reviewed: Allergy & Precautions, NPO status , Patient's Chart, lab work & pertinent test results, reviewed documented beta blocker date and time   History of Anesthesia Complications Negative for: history of anesthetic complications  Airway Mallampati: I  TM Distance: >3 FB Neck ROM: Full    Dental no notable dental hx.    Pulmonary neg shortness of breath, COPD   breath sounds clear to auscultation       Cardiovascular (-) hypertension(-) angina (-) CAD and (-) Past MI  Rhythm:Regular Rate:Normal     Neuro/Psych neg Seizures  Anxiety        GI/Hepatic ,neg GERD  ,,(+) neg Cirrhosis        Endo/Other  neg diabetes    Renal/GU Renal disease     Musculoskeletal  (+) Arthritis ,    Abdominal   Peds  Hematology   Anesthesia Other Findings   Reproductive/Obstetrics                             Anesthesia Physical Anesthesia Plan  ASA: 3  Anesthesia Plan: General   Post-op Pain Management:    Induction: Intravenous  PONV Risk Score and Plan: 2 and Ondansetron and Dexamethasone  Airway Management Planned: Oral ETT  Additional Equipment:   Intra-op Plan:   Post-operative Plan: Extubation in OR  Informed Consent: I have reviewed the patients History and Physical, chart, labs and discussed the procedure including the risks, benefits and alternatives for the proposed anesthesia with the patient or authorized representative who has indicated his/her understanding and acceptance.     Dental advisory given  Plan Discussed with: CRNA  Anesthesia Plan Comments:        Anesthesia Quick Evaluation

## 2023-02-24 NOTE — H&P (Signed)
Chief Complaint   Pituitary tumor  History of Present Illness  Mr. Edward Horton is a 62 year old man seen for initial consultation at the request of Dr. Sharl Ma. 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 Briefly, the patient has been complaining of relatively chronic generalized fatigue and weakness. 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 He has since been started on hormone replacement therapy including Synthroid, testosterone, and hydrocortisone. 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 He was therefore referred for neurosurgical evaluation. Upon questioning, the patient does report some vision problems although he has a history of Fuchs dystrophy. He has been referred for formal visual field testing to be done later this month.  Of note, the patient reports a history of dyslipidemia. No history of hypertension or diabetes. No previous heart disease or stroke. He reports a history of emphysema diagnosis, but does not require any supplemental oxygen. No history of liver or kidney disease. He denies any cancer history. He is not on any blood thinners or antiplatelet agents. He is a nonsmoker.   Past Medical History   Past Medical History:  Diagnosis Date   Anxiety    Aortic ectasia (HCC)    Arthritis    Atherosclerosis of aorta (HCC)    Centrilobular emphysema (HCC)    Fuchs' corneal dystrophy of right eye    Lung nodules    Peyronie's disease     Past Surgical History   Past Surgical History:  Procedure Laterality Date   IRRIGATION AND DEBRIDEMENT SHOULDER Left 03/02/2017   Procedure: IRRIGATION AND DEBRIDEMENT SCOPE LEFT SHOULDER;  Surgeon: Bjorn Pippin, MD;  Location: [facility];  Service: Orthopedics;  Laterality: Left;   REVERSE SHOULDER ARTHROPLASTY Left 05/04/2017   Procedure: LEFT REVERSE  TOTAL SHOULDER ARTHROPLASTY;  Surgeon: Bjorn Pippin, MD;  Location: [facility];  Service: Orthopedics;  Laterality: Left;   SHOULDER ACROMIOPLASTY Left 03/02/2017   Procedure: LEFT SHOULDER ACROMIOPLASTY;  Surgeon: Bjorn Pippin, MD;  Location: [facility];  Service: Orthopedics;  Laterality: Left;   SHOULDER ARTHROSCOPY WITH SUBACROMIAL DECOMPRESSION Left 03/02/2017   Procedure: SHOULDER ARTHROSCOPY WITH SUBACROMIAL DECOMPRESSION;  Surgeon: Bjorn Pippin, MD;  Location: [facility];  Service: Orthopedics;  Laterality: Left;   WISDOM TOOTH EXTRACTION      Social History   Social History   Tobacco Use   Smoking status: Never   Smokeless tobacco: Never  Vaping Use   Vaping status: Never Used  Substance Use Topics   Alcohol use: No   Drug use: No    Medications   Prior to Admission medications   Medication Sig Start Date End Date Taking? Authorizing Provider  celecoxib (CELEBREX) 200 MG capsule Take 200 mg by mouth daily. 09/02/22  Yes [provider]  Cholecalciferol (VITAMIN D) 50 MCG (2000 UT) tablet Take 2,000 Units by mouth daily.   Yes [provider]  COVID-19 mRNA bivalent vaccine, Pfizer, (PFIZER COVID-19 VAC BIVALENT) injection Inject into the muscle. 01/14/21  Yes Judyann Munson, MD  hydrocortisone (CORTEF) 10 MG tablet Take 10 mg by mouth daily.   Yes [provider]  levothyroxine (SYNTHROID) 50 MCG tablet Take 50 mcg by mouth daily before breakfast. 11/22/22  Yes [provider]  Methylcobalamin (B-12) 5000 MCG TBDP Take 5,000 mcg by mouth daily.   Yes [provider]  Multiple Vitamin (MULTIVITAMIN WITH MINERALS) TABS tablet Take 1 tablet by mouth  daily. Centrum Multivitamin   Yes [provider]  Omega-3 Fatty Acids (FISH OIL) 1000 MG CAPS Take 2,000 mg by mouth daily.   Yes [provider]  rosuvastatin (CRESTOR) 10 MG tablet Take 10 mg by mouth daily.   Yes [provider]  sodium chloride (MURO 128) 5 %  ophthalmic solution Place 1 drop into both eyes 4 (four) times daily as needed for eye irritation.   Yes [provider]  TURMERIC CURCUMIN PO Take 2,000 mg by mouth daily.   Yes [provider]  vitamin E 180 MG (400 UNITS) capsule Take 400 Units by mouth daily.   Yes [provider]    Allergies  No Known Allergies  Review of Systems  ROS  Neurologic Exam  Awake, alert, oriented Memory and concentration grossly intact Speech fluent, appropriate CN grossly intact Motor exam: Upper Extremities Deltoid Bicep Tricep Grip  Right 5/5 5/5 5/5 5/5  Left 5/5 5/5 5/5 5/5   Lower Extremities IP Quad PF DF EHL  Right 5/5 5/5 5/5 5/5 5/5  Left 5/5 5/5 5/5 5/5 5/5   Sensation grossly intact to LT  Imaging  There is a ~2cm pituitary/suprasella tumor with slight mass effect on the chiasm. No cav sinus invasion noted.  Impression  - 62 y.o. 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  Plan  - We will plan on proceeding with endoscopic transsphenoidal resection of tumor  I have reviewed the indications for the procedure as well as the details of the procedure and the expected postoperative course and recovery at length with the patient and his wife in the office. We have also reviewed in detail the risks, benefits, and alternatives to the procedure. All questions were answered and Edward Horton provided informed consent to proceed.  Lisbeth Renshaw, MD [facility]

## 2023-02-24 NOTE — Anesthesia Procedure Notes (Signed)
Arterial Line Insertion Start/End12/13/2024 8:14 AM, 02/24/2023 8:17 AM Performed by: Mariann Barter, MD, Yolonda Kida, CRNA, CRNA  Patient location: OR. Preanesthetic checklist: patient identified, IV checked, site marked, risks and benefits discussed, surgical consent, monitors and equipment checked, pre-op evaluation, timeout performed and anesthesia consent Left, radial was placed Catheter size: 20 G Hand hygiene performed  and maximum sterile barriers used   Attempts: 1 Procedure performed without using ultrasound guided technique. Following insertion, dressing applied and Biopatch. Post procedure assessment: normal  Patient tolerated the procedure well with no immediate complications.

## 2023-02-24 NOTE — Anesthesia Postprocedure Evaluation (Signed)
Anesthesia Post Note  Patient: Edward Horton  Procedure(s) Performed: ENDOSCOPIC TRANSNASAL TRANSPHENOID RESECTION OF PITUITARY TUMOR TRANSPHENOIDAL APPROACH EXPOSURE WITH FUSION SEPTOPLASTY INFERIOR TURBINATE REDUCTION     Patient location during evaluation: PACU Anesthesia Type: General Level of consciousness: awake and alert Pain management: pain level controlled Vital Signs Assessment: post-procedure vital signs reviewed and stable Respiratory status: spontaneous breathing, nonlabored ventilation, respiratory function stable and patient connected to nasal cannula oxygen Cardiovascular status: blood pressure returned to baseline and stable Postop Assessment: no apparent nausea or vomiting Anesthetic complications: no   No notable events documented.  Last Vitals:  Vitals:   02/24/23 1200 02/24/23 1215  BP: 131/87 128/83  Pulse: 84 77  Resp: 20 12  Temp:    SpO2: 97% 92%    Last Pain:  Vitals:   02/24/23 8413  PainSc: 0-No pain                 Mariann Barter

## 2023-02-24 NOTE — Op Note (Signed)
OPERATIVE NOTE  Hewitt Almand Date/Time of Admission: 02/24/2023  5:28 AM  CSN: [id number];MRN:[id number] Attending Provider: Lisbeth Renshaw, MD Room/Bed: MCPO/NONE DOB: [birth year] Age: 62 y.o.   Pre-Op Diagnosis: PITUITARY MACROADENOMA  Post-Op Diagnosis: PITUITARY MACROADENOMA  Procedure: Procedure(s): ENDOSCOPIC ENDONASAL TRANSPHENOIDAL RESECTION OF PITUITARY TUMOR, SEPTOPLASTY BILATERAL INFERIOR TURBINATE REDUCTION  Anesthesia: General  Surgeon(s): Carlton Buskey A Lashona Schaaf, DO - Co-surgeon Lisbeth Renshaw, MD - Co-surgeon  Staff: Circulator: Hermelinda Dellen, RN; Ellin Goodie, RN; Jeronimo Greaves, RN Scrub Person: Gaspar Bidding, CST; Carmela Rima Vendor Representative : Debbe Mounts Page, Nedra Hai Circulator Assistant: Lonna Cobb, RN  Implants: Implant Name Type Inv. Item Serial No. Manufacturer Lot No. LRB No. Used Action  GRAFT DURAGEN MATRIX [id number] - [id number] Tissue GRAFT DURAGEN MATRIX [id number]  [employer] [id number] N/A 1 Implanted    Specimens: ID Type Source Tests Collected by Time Destination  1 : Pituitary Tumor Tissue Path Tissue SURGICAL PATHOLOGY Lisbeth Renshaw, MD 02/24/2023 1020     Complications: None  EBL: 150 ML  Condition: stable  Operative Findings:  Pituitary macroadenoma with suprasellar extension, right septal deviation with significant bony spurring bilaterally  Description of Operation: Once operative consent was obtained and the site and surgery were confirmed with the patient and the operating room team, the patient was brought back to the operating room and general endotracheal anesthesia was obtained. The patient was then turned over to the ENT service, at which time the image-guided system was attached and noted to be in good calibration. Lidocaine 1% with 1:100,000 epinephrine was injected into the nasal septum bilaterally, inferior turbinates bilaterally, the middle turbinates bilaterally, and the  axilla between the medial turbinate and the lateral nasal wall. Adrenaline-soaked pledgets were placed into the nasal cavity, and the patient was prepped and draped in sterile fashion. The rigid scope was placed in the nasal cavities and due to difficulty visualizing the right sphenoid os secondary to septal deviation, decision was made to proceed with septoplasty. Attention was first turned to the left nasal vestibule. A left sided hemi-transfixion incision was made a submucoperichondrial flap was elevated on the left.  A submucous resection of  nasal septal cartilage was performed with care taken to leave a 1 cm caudal and dorsal strut. In doing this, a right-sided submucoperichondrial flap was elevated.  The bony nasal septum was addressed by lifting up the soft tissues, separating the superior septum with a double action scissor and then removing the inferior deflected portion. The nasal spine was removed using the Risk analyst and Lenoria Chime. With this completed, the nasal septum was midline. Attention was then turned to the right nasal vestibule.  The inferior and middle turbinate were gently lateralized using a Cottle elevator until the superior turbinate was identified.  This was also lateralized until the natural sinus ostium was appreciated.  This was serially dilated using a mushroom punch and an up-biting Kerrison.  A posterior septectomy was then performed using a combination of a Cottle elevator, Tru-Cut forceps and Kerrisons.  The nasoseptal flap pedicle was carefully preserved. The left middle turbinate and superior turbinate were then identified and gently lateralized until the natural sphenoid os was identified and serially dilated using the sphenoid dilator, mushroom punch and up-biting Kerrisons.  With the bilateral sphenoid cavities in view, the remaining bone of the sphenoid face was removed using Kerrison forceps.  A diamond bur drill was also used to complete the bony resection and to  remove the intrasinus septum until the bone overlying the  pituitary adenoma was completely visualized.  This bone was removed using Kerrison forceps until the tumor was completely visualized. Dr. Conchita Paris then scrubbed in and completely resected the tumor, please see his operative note for details regarding the repair.  There was no evidence of CSF leak following tumor removal. Floseal was placed in the defect, followed by Duragen and Adheris was used to completely cover the defect.  Arista and Posisep nasal packing was placed in bilateral nasal cavities in the sphenoid defect. The submucoperichondrial flaps were returned to their anatomic position and hemi-transfixion incision was closed with interrupted 4-0 chromic gut. A 4-0 plain gut suture was used to perform a mattress style stitch in a circular direction from anterior to posterior septum to re-approximate the right and left sided flaps. Attention was then turned to the inferior turbinates.They were outfractured and then submucous resection was performed by making an incision in the leading edge with a 15 blade, separating the mucosa from bone with a Cottle elevator and then using the micro debrider with a turbinate blade to remove bone.   Doyle nasal splints were placed in the bilateral nasal cavities and sutured to the columella with a 3-0 silk suture and a nasal drip pad was applied. An orogastric tube was placed and the stomach cavity was suctioned to reduce postoperative nausea. The patient was turned over to anesthesia service and was extubated in the operating room and transferred to the PACU in stable condition.     Laren Boom, DO [facility]  02/24/2023

## 2023-02-24 NOTE — Discharge Instructions (Signed)
[facility]  Post Operative Instructions  Office: [phone]  The Surgery Itself Endoscopic sinus surgery (with or without septoplasty and turbinate reduction) involves general anesthesia. Patients may be sedated for several hours after surgery and may remain sleepy for the better part of the day. Nausea and vomiting are occasionally seen, and usually resolve by the evening of surgery - even without additional medications.   After Surgery  Facial pressure and fullness similar to a sinus infection/headache is normal after surgery. Breathing through your nose is also difficult due to swelling. A humidifier or vaporizer can be used in the bedroom to prevent throat pain with mouth breathing.   Bloody nasal drainage is normal after this surgery for 5-7 days, usually decreasing in volume with each day that passes. Drainage will flow from the front of the nose and down the back of the throat. Make sure you spit out blood drainage that drips down the back of your throat to prevent nausea/vomiting. You will have a nasal drip pad/sling with gauze to catch drainage from the front of your nose. The dressing may need to be changed frequently during the first 24 hours following surgery. In case of profuse nasal bleeding, you may spray Afrin in both nasal cavities, apply ice to the bridge of the nose and pinch the nose just above the tip and hold for 10 minutes; if bleeding continues, contact the doctors office.   Frequent hot showers or saline nasal rinses (NeilMed) will help break up congestion and clear any clot or mucus that builds up within the nose after surgery. This can be started the day of surgery.   It is more comfortable to sleep with extra pillows or in a recliner for the first few days after surgery until the drainage begins to resolve.    Do not blow your nose for 2 weeks after surgery.   Avoid lifting > 10 lbs. and no vigorous exercise for 2 weeks after surgery.   Avoid airplane travel  for 2 weeks following sinus surgery; the cabin pressure changes can cause pain and swelling within the nose/sinuses.   Sense of smell and taste are often diminished for several weeks after surgery. There may be some tenderness or numbness in your upper front teeth, which is normal after surgery. You may express old clot, discolored mucus or very large nasal crusts from your nose for up to 3-4 weeks after surgery; depending on how frequently and how effectively you irrigate your nose with the saltwater spray.   You may have absorbable sutures inside of your nose after surgery that will slowly dissolve in 2-3 weeks. Be careful when clearing crusts from the nose since they may be attached to these sutures.  Medications  Pain medication can be used for pain as prescribed. Pain and pressure in the nose is expected after surgery. As the surgical site heals, pain will resolve over the course of a week. Pain medications can cause nausea, which can be prevented if you take them with food or milk.   You may be given an antibiotic for one week after surgery to prevent infection. Take this medication with food to prevent nausea or vomiting.   You can use 2 nasal sprays after surgery: Afrin can be used up to 2 times a day for up to 3 days after surgery (best before bed) to reduce bloody drainage from the nose for the first few days after surgery. Saline/salt water spray should be used at least 4-6 times per day,  starting the day after surgery to prevent crusting inside of the nose.   Take all of your routine medications as prescribed, unless told otherwise by your surgeon. Any medications that thin the blood should be avoided. This includes aspirin. Avoid aspirin-like products for the first 72 hours after surgery (Advil, Motrin, Excedrin, Alieve, Celebrex, Naprosyn), but you may use them as needed for pain after 72 hours.

## 2023-02-24 NOTE — Transfer of Care (Signed)
Immediate Anesthesia Transfer of Care Note  Patient: Edward Horton  Procedure(s) Performed: ENDOSCOPIC TRANSNASAL TRANSPHENOID RESECTION OF PITUITARY TUMOR TRANSPHENOIDAL APPROACH EXPOSURE WITH FUSION SEPTOPLASTY INFERIOR TURBINATE REDUCTION  Patient Location: PACU  Anesthesia Type:General  Level of Consciousness: awake, drowsy, and patient cooperative  Airway & Oxygen Therapy: Patient Spontanous Breathing and Patient connected to face mask oxygen  Post-op Assessment: Report given to RN and Post -op Vital signs reviewed and stable  Post vital signs: Reviewed and stable  Last Vitals:  Vitals Value Taken Time  BP 128/87 02/24/23 1134  Temp    Pulse 81 02/24/23 1135  Resp 24 02/24/23 1135  SpO2 98 % 02/24/23 1135  Vitals shown include unfiled device data.  Last Pain:  Vitals:   02/24/23 0932  PainSc: 0-No pain         Complications: No notable events documented.

## 2023-02-24 NOTE — Consult Note (Signed)
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  Underwent endoscopic transnasal transsphenoidal resection of pituitary tumor, septoplasty with bilateral inferior turbinate reduction by Dr. Conchita Paris with NSGY and Dr. Marene Lenz with ENT.  Extubated, and returns to Neuro ICU post-operatively.  PCCM consulted to assist with medical management in ICU.   Currently patient complaints of 2/10 frontal headache or feels like "one coming on", denies any nasal or post-nasal drainage, no N/V, visual or focal deficits.    Pertinent  Medical History   Past Medical History:  Diagnosis Date   Anxiety    Aortic ectasia (HCC)    Arthritis    Atherosclerosis of aorta (HCC)    Centrilobular emphysema (HCC)    Fuchs' corneal dystrophy of right eye    Lung nodules    Peyronie's disease    Significant Hospital Events: Including procedures, antibiotic start and stop dates in addition to other pertinent events   12/13 endoscopic transnasal transsphenoidal resection of pituitary tumor,   Interim History / Subjective:   Objective   Blood pressure 105/75, pulse 85, temperature 98.4 F (36.9 C), temperature source Oral, resp. rate 16, height 5\' 11"  (1.803 m), weight 88.5 kg, SpO2 93%.        Intake/Output Summary (Last 24 hours) at 02/24/2023 1647 Last data filed at 02/24/2023 1600 Gross per 24 hour  Intake 1495 ml  Output 1385 ml  Net 110 ml   Filed Weights   02/24/23 0553  Weight: 88.5 kg   Examination: General:  Pleasant older male sitting upright in bed eating dinner in NAD HEENT: MM  pink/moist, pupils 4/r, no nasal drainage, dressing dry, no obvious visual deficits Neuro: Aox3, MAE, non focal CV: rr, NSR 90's, no murmur, left radial aline PULM:  non labored, clear GI: soft, bs+, NT, foley- cyu Extremities: warm/dry, no LE edema  Skin: no rashes   Resolved Hospital Problem list    Assessment & Plan:   Pituitary macroadenoma with panhypopituitarism s/p endoscopic transnasal transsphenoidal resection of pituitary tumor with septoplasty and bilateral inferior turbinate reduction P:  - Per NSGY/ ENT - CSF precautions - serial neuro exams - SBP goal < 150, continue aline for now - continue foley overnight, monitor UOP, trend renal function - prn labetalol - multimodal pain management w/ bowel regimen - complete post op abx/ ancef - cont hydrocortisone, synthroid - follow pathology  PCCM will follow while in ICU  Best Practice (right click and "Reselect all SmartList Selections" daily)   Diet/type: Regular consistency (see orders) DVT prophylaxis SCD Pressure ulcer(s): N/A GI prophylaxis: N/A Lines: Arterial Line Foley:  Yes, and it is still needed Code Status:  full code Last date of multidisciplinary goals of care discussion [per primary]  Wife at bedside.    Labs   CBC: Recent Labs  Lab 02/22/23 0850  WBC 4.8  HGB 14.7  HCT 43.3  MCV 83.6  PLT 229    Basic Metabolic Panel: Recent Labs  Lab 02/22/23 0850  NA 139  K 4.7  CL 107  CO2 25  GLUCOSE 94  BUN 21  CREATININE 0.61  CALCIUM 9.8   GFR: Estimated Creatinine Clearance: 99.4 mL/min (  by C-G formula based on SCr of 0.61 mg/dL). Recent Labs  Lab 02/22/23 0850  WBC 4.8    Liver Function Tests: No results for input(s): "AST", "ALT", "ALKPHOS", "BILITOT", "PROT", "ALBUMIN" in the last 168 hours. No results for input(s): "LIPASE", "AMYLASE" in the last 168 hours. No results for input(s): "AMMONIA" in the last 168 hours.  ABG No results found for: "PHART", "PCO2ART", "PO2ART",  "HCO3", "TCO2", "ACIDBASEDEF", "O2SAT"   Coagulation Profile: No results for input(s): "INR", "PROTIME" in the last 168 hours.  Cardiac Enzymes: No results for input(s): "CKTOTAL", "CKMB", "CKMBINDEX", "TROPONINI" in the last 168 hours.  HbA1C: No results found for: "HGBA1C"  CBG: No results for input(s): "GLUCAP" in the last 168 hours.  Review of Systems:   Per HPI otherwise neg  Past Medical History:  He,  has a past medical history of Anxiety, Aortic ectasia (HCC), Arthritis, Atherosclerosis of aorta (HCC), Centrilobular emphysema (HCC), Fuchs' corneal dystrophy of right eye, Lung nodules, and Peyronie's disease.   Surgical History:   Past Surgical History:  Procedure Laterality Date   IRRIGATION AND DEBRIDEMENT SHOULDER Left 03/02/2017   Procedure: IRRIGATION AND DEBRIDEMENT SCOPE LEFT SHOULDER;  Surgeon: Bjorn Pippin, MD;  Location: [facility];  Service: Orthopedics;  Laterality: Left;   REVERSE SHOULDER ARTHROPLASTY Left 05/04/2017   Procedure: LEFT REVERSE  TOTAL SHOULDER ARTHROPLASTY;  Surgeon: Bjorn Pippin, MD;  Location: [facility];  Service: Orthopedics;  Laterality: Left;   SHOULDER ACROMIOPLASTY Left 03/02/2017   Procedure: LEFT SHOULDER ACROMIOPLASTY;  Surgeon: Bjorn Pippin, MD;  Location: [facility];  Service: Orthopedics;  Laterality: Left;   SHOULDER ARTHROSCOPY WITH SUBACROMIAL DECOMPRESSION Left 03/02/2017   Procedure: SHOULDER ARTHROSCOPY WITH SUBACROMIAL DECOMPRESSION;  Surgeon: Bjorn Pippin, MD;  Location:  SURGERY CENTER;  Service: Orthopedics;  Laterality: Left;   WISDOM TOOTH EXTRACTION       Social History:   reports that he has never smoked. He has never used smokeless tobacco. He reports that he does not drink alcohol and does not use drugs.   Family History:  His family history is not on file.   Allergies No Known Allergies   Home Medications  Prior to Admission medications    Medication Sig Start Date End Date Taking? Authorizing Provider  celecoxib (CELEBREX) 200 MG capsule Take 200 mg by mouth daily. 09/02/22  Yes [provider]  Cholecalciferol (VITAMIN D) 50 MCG (2000 UT) tablet Take 2,000 Units by mouth daily.   Yes [provider]  COVID-19 mRNA bivalent vaccine, Pfizer, (PFIZER COVID-19 VAC BIVALENT) injection Inject into the muscle. 01/14/21  Yes Judyann Munson, MD  hydrocortisone (CORTEF) 10 MG tablet Take 10 mg by mouth daily.   Yes [provider]  levothyroxine (SYNTHROID) 50 MCG tablet Take 50 mcg by mouth daily before breakfast. 11/22/22  Yes [provider]  Methylcobalamin (B-12) 5000 MCG TBDP Take 5,000 mcg by mouth daily.   Yes [provider]  Multiple Vitamin (MULTIVITAMIN WITH MINERALS) TABS tablet Take 1 tablet by mouth daily. Centrum Multivitamin   Yes [provider]  Omega-3 Fatty Acids (FISH OIL) 1000 MG CAPS Take 2,000 mg by mouth daily.   Yes [provider]  rosuvastatin (CRESTOR) 10 MG tablet Take 10 mg by mouth daily.   Yes [provider]  sodium chloride (MURO 128) 5 % ophthalmic solution Place 1 drop into both eyes 4 (four) times daily as needed for eye irritation.   Yes  [provider]  TURMERIC CURCUMIN PO Take 2,000 mg by mouth daily.   Yes [provider]  vitamin E 180 MG (400 UNITS) capsule Take 400 Units by mouth daily.   Yes [provider]     Critical care time: 40 mins       Posey Boyer, MSN, AG-ACNP-BC [facility] 02/24/2023, 4:48 PM  See Amion for pager If no response to pager , please call [phone] until 7pm After 7:00 pm call Elink  [phone]

## 2023-02-24 NOTE — Op Note (Signed)
  NEUROSURGERY OPERATIVE NOTE   PREOP DIAGNOSIS:  Pituitary macroadenoma   POSTOP DIAGNOSIS: Same  PROCEDURE: Endoscopic transnasal transsphenoidal resection of pituitary tumor  SURGEON: Dr. Lisbeth Renshaw, MD  CO-SURGEON: Dr. Cheron Schaumann, DO  ANESTHESIA: General Endotracheal  EBL: See anesthesia records  SPECIMENS: Pituitary tumor for permanent pathology  DRAINS: None  COMPLICATIONS: None immediate  CONDITION: Hemodynamically stable to PACU  HISTORY: Edward Horton is a 62 y.o. male who has been complaining of relatively chronic generalized fatigue and weakness. 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 He has since been started on hormone replacement therapy including Synthroid, testosterone, and hydrocortisone. MRI did reveal the presence of a pituitary macroadenoma with suprasellar extension. Operative resection was therefore recommended. The risks, benefits, and alternatives to surgery as well as expected postop course and recovery were all reviewed in detail with the patient and his wife. After all questions were answered, informed consent was obtained and witnessed.  PROCEDURE IN DETAIL: The patient was brought to the operating room. After induction of general anesthesia, the patient was positioned on the operative table in the supine position. All pressure points were meticulously padded. Skin of the mid-face was this prepped and draped in the usual sterile fashion.  After timeout was conducted, approach to the sella was effected by Dr. Marene Lenz, details of which are reported separately. Once access to the sella was obtained, Kerrison punches were used to open up the anterior wall of the sella.  The opening was carried out laterally to the edges of the cavernous sinus and superiorly to the posterior edge of the planum.  The dura overlying the anterior aspect of the sella was  then coagulated with the bipolar and opened sharply.  A small amount of bleeding from the superior aspect on the right side was easily controlled with morselized Gelfoam with thrombin.  The underlying tumor was noted to be soft, with what appeared to be some blood clot within the central aspect of the tumor.  Portions of tumor were removed using a combination of ring curettes and pituitary rongeur's which was sent for permanent pathology.  Using a combination of angled ring curettes, the tumor was removed from the floor of the sella.  I then worked on removing tumor from the lateral aspect of the sella on the right side and then the left.  As the sella portion of the tumor was removed, more tumor appeared to drop down from the suprasellar space.  Ultimately, it appeared that the diaphragm sella came into view and dropped down into the sella itself at the level of the anterior face.  It therefore appeared that the tumor was resected completely.  At this point hemostasis was easily secured with morselized Gelfoam with thrombin.  The face of the sella was then covered with a layer of DuraGen onlay and adherence sealant.  This was then covered with Gelfoam.  The remainder of the closure was completed by Dr. Irene Limbo, details again dictated separately.  At the end of the case all sponge, needle, and instrument counts were correct. The patient was then transferred to the stretcher, extubated, and taken to the post-anesthesia care unit in stable hemodynamic condition.   Lisbeth Renshaw, MD [facility]

## 2023-02-24 NOTE — Progress Notes (Signed)
Patient admitted s/p transsphenoidal resection. Patient had complaints of pain during shift. PRN Norco and tylenol given, patient expressed relief. Patient and family updated in plan of care. ICU status maintained.

## 2023-02-25 DIAGNOSIS — D352 Benign neoplasm of pituitary gland: Secondary | ICD-10-CM | POA: Diagnosis not present

## 2023-02-25 LAB — BASIC METABOLIC PANEL
Anion gap: 9 (ref 5–15)
BUN: 18 mg/dL (ref 8–23)
CO2: 22 mmol/L (ref 22–32)
Calcium: 9.1 mg/dL (ref 8.9–10.3)
Chloride: 106 mmol/L (ref 98–111)
Creatinine, Ser: 0.79 mg/dL (ref 0.61–1.24)
GFR, Estimated: >60 mL/min (ref >60)
Glucose, Bld: 121 mg/dL — ABNORMAL HIGH (ref 70–99)
Potassium: 4.1 mmol/L (ref 3.5–5.1)
Sodium: 137 mmol/L (ref 135–145)

## 2023-02-25 MED ORDER — SALINE SPRAY 0.65 % NA SOLN: 1.0000 | NASAL | Status: DC | PRN

## 2023-02-25 MED ORDER — ENOXAPARIN SODIUM 40 MG/0.4ML IJ SOSY
40.0000 mg | PREFILLED_SYRINGE | Freq: Every day | INTRAMUSCULAR | Status: DC
  Administered 2023-02-26: 40 mg via SUBCUTANEOUS
  Filled 2023-02-25: qty 0.4

## 2023-02-25 NOTE — Progress Notes (Signed)
eLink Physician-Brief Progress Note Patient Name: Edward Horton DOB: Dec 07, 1960 MRN: [id number]   Date of Service  02/25/2023  HPI/Events of Note  S/p endoscopic transsphenoidal resection of pituitary tumor   Patient is having significant congestion with associated anxiety.  Requesting anxiolytic.  eICU Interventions  Continue Ocean Spray every 4 hours, add every 2 hours as needed  Maintain Afrin for epistaxis.  Patient's telemetry reviewed-findings seem consistent with previously noted right bundle branch block.  Obtain routine ECG.     Intervention Category Minor Interventions: Routine modifications to care plan (e.g. PRN medications for pain, fever)  Tanicia Wolaver 02/25/2023, 10:54 PM

## 2023-02-25 NOTE — Evaluation (Signed)
Physical Therapy Evaluation Patient Details Name: Edward Horton MRN: [id number] DOB: [birth year] Today's Date: 02/25/2023  History of Present Illness  Pt is a 62 y.o. male s/p endoscopic transnasal transsphenoidal resection of pituitary tumor. PMH: OA, h/o L revers TSA, peyronie's disease, anxiety   Clinical Impression  Pt admitted with above diagnosis. PTA pt lived at home with wife; active, independent, driving and employed. Pt currently with functional limitations due to the deficits listed below (see PT Problem List). On eval, he required light min assist for bed mobility and transfers. CGA ambulation 175' with RW. RW utilized due to pt first time out of bed and presenting with mild instability. Pt progressed to ambulating in room without AD by end of session. Pt will benefit from acute skilled PT to increase their independence and safety with mobility to allow discharge.  PT to follow acutely. Suspect on follow up or DME needs.         If plan is discharge home, recommend the following: Assist for transportation;A little help with bathing/dressing/bathroom;Help with stairs or ramp for entrance;Assistance with cooking/housework   Can travel by private vehicle        Equipment Recommendations None recommended by PT  Recommendations for Other Services       Functional Status Assessment Patient has had a recent decline in their functional status and demonstrates the ability to make significant improvements in function in a reasonable and predictable amount of time.     Precautions / Restrictions Precautions Precautions: Fall      Mobility  Bed Mobility Overal bed mobility: Needs Assistance Bed Mobility: Supine to Sit, Sit to Supine     Supine to sit: Min assist, Used rails, HOB elevated Sit to supine: Min assist, HOB elevated, Used rails   General bed mobility comments: light min assist for trunk OOB and BLE back to bed    Transfers Overall transfer level: Needs  assistance Equipment used: Ambulation equipment used Transfers: Sit to/from Stand Sit to Stand: Min assist           General transfer comment: light min assist to power up    Ambulation/Gait Ambulation/Gait assistance: Contact guard assist Gait Distance (Feet): 175 Feet Assistive device: Rolling walker (2 wheels) Gait Pattern/deviations: Step-through pattern, Decreased stride length Gait velocity: decreased Gait velocity interpretation: <1.31 ft/sec, indicative of household ambulator   General Gait Details: steady gait with RW. Progressed to amb without AD in room at end of session CGA.  Stairs            Wheelchair Mobility     Tilt Bed    Modified Rankin (Stroke Patients Only)       Balance Overall balance assessment: Needs assistance Sitting-balance support: No upper extremity supported, Feet supported Sitting balance-Leahy Scale: Good     Standing balance support: Bilateral upper extremity supported, During functional activity, No upper extremity supported Standing balance-Leahy Scale: Fair Standing balance comment: RW for hallway amb                             Pertinent Vitals/Pain Pain Assessment Pain Assessment: Faces Faces Pain Scale: Hurts a little bit Pain Location: frontal headache Pain Descriptors / Indicators: Heaviness, Headache Pain Intervention(s): Monitored during session, Repositioned    Home Living Family/patient expects to be discharged to:: Private residence Living Arrangements: Spouse/significant other;Children Available Help at Discharge: Family;Available 24 hours/day Type of Home: House Home Access: Stairs to enter Entrance Stairs-Rails: Right (wall for  support on L) Entrance Stairs-Number of Steps: 6   Home Layout: Bed/bath upstairs;1/2 bath on main level Home Equipment: Shower seat - built in      Prior Function Prior Level of Function : Independent/Modified Independent;Working/employed;Driving                ADLs Comments: 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     Extremity/Trunk Assessment   Upper Extremity Assessment Upper Extremity Assessment: Right hand dominant (h/o L reverse TSA, reports OA and need for R shoulder sx)    Lower Extremity Assessment Lower Extremity Assessment: Generalized weakness    Cervical / Trunk Assessment Cervical / Trunk Assessment: Normal  Communication   Communication Communication: No apparent difficulties  Cognition Arousal: Alert Behavior During Therapy: WFL for tasks assessed/performed, Flat affect Overall Cognitive Status: Within Functional Limits for tasks assessed                                          General Comments General comments (skin integrity, edema, etc.): VSS on RA    Exercises     Assessment/Plan    PT Assessment Patient needs continued PT services  PT Problem List Decreased strength;Decreased balance;Pain;Decreased mobility;Decreased activity tolerance       PT Treatment Interventions Functional mobility training;DME instruction;Balance training;Patient/family education;Gait training;Therapeutic activities;Stair training    PT Goals (Current goals can be found in the Care Plan section)  Acute Rehab PT Goals Patient Stated Goal: home PT Goal Formulation: With patient/family Time For Goal Achievement: 03/04/23 Potential to Achieve Goals: Good    Frequency Min 1X/week     Co-evaluation               AM-PAC PT "6 Clicks" Mobility  Outcome Measure Help needed turning from your back to your side while in a flat bed without using bedrails?: A Little Help needed moving from lying on your back to sitting on the side of a flat bed without using bedrails?: A Little Help needed moving to and from a bed to a chair (including a wheelchair)?: A Little Help needed standing up from a chair using your arms (e.g., wheelchair or bedside chair)?: A Little Help needed to walk in hospital room?:  A Little Help needed climbing 3-5 steps with a railing? : A Little 6 Click Score: 18    End of Session Equipment Utilized During Treatment: Gait belt Activity Tolerance: Patient tolerated treatment well Patient left: in bed;with call bell/phone within reach;with family/visitor present Nurse Communication: Mobility status PT Visit Diagnosis: Muscle weakness (generalized) (M62.81);Difficulty in walking, not elsewhere classified (R26.2)    Time: 4401-0272 PT Time Calculation (min) (ACUTE ONLY): 32 min   Charges:   PT Evaluation $PT Eval High Complexity: 1 High PT Treatments $Gait Training: 8-22 mins PT General Charges $$ ACUTE PT VISIT: 1 Visit         Ferd Glassing., PT  Office # [phone]   Ilda Foil 02/25/2023, 11:43 AM

## 2023-02-25 NOTE — Progress Notes (Signed)
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  Underwent endoscopic transnasal transsphenoidal resection of pituitary tumor, septoplasty with bilateral inferior turbinate reduction by Dr. Conchita Paris with NSGY and Dr. Marene Lenz with ENT.  Extubated, and returns to Neuro ICU post-operatively.  PCCM consulted to assist with medical management in ICU.   Currently patient complaints of 2/10 frontal headache or feels like "one coming on", denies any nasal or post-nasal drainage, no N/V, visual or focal deficits.    Pertinent  Medical History   Past Medical History:  Diagnosis Date   Anxiety    Aortic ectasia (HCC)    Arthritis    Atherosclerosis of aorta (HCC)    Centrilobular emphysema (HCC)    Fuchs' corneal dystrophy of right eye    Lung nodules    Peyronie's disease    Significant Hospital Events: Including procedures, antibiotic start and stop dates in addition to other pertinent events   12/13 endoscopic transnasal transsphenoidal resection of pituitary tumor,   Interim History / Subjective:  No events overnight.  2/10 frontal headache well controlled overnight.  Denies nausea, visual changes.  UOP stable  Objective   Blood pressure 113/82, pulse 83, temperature 98.1 F (36.7 C), resp. rate 15, height 5\' 11"  (1.803 m), weight 88.5 kg, SpO2 94%.        Intake/Output Summary (Last 24 hours) at 02/25/2023 0813 Last data filed at 02/25/2023 4696 Gross per 24 hour  Intake 1540 ml  Output 2690 ml  Net -1150 ml   Filed Weights   02/24/23 0553  Weight: 88.5 kg    Examination: General:  pleasant older male sitting upright in bed after eating breakfast in NAD HEENT: MM pink/moist, pupils 4/r, some dk/ clotted residual blood in left nare with minimal dry drainage to nasal bandage, no active drainage Neuro: alert, oriented x3, non focal, MAE CV: rr, NSR, left radial aline PULM:  non labored, CTA, room air GI: soft, bs+, NT, foley- cyu Extremities: warm/dry, no LE edema  Skin: no rashes   BMET stable  Resolved Hospital Problem list    Assessment & Plan:   Pituitary macroadenoma with panhypopituitarism s/p endoscopic transnasal transsphenoidal resection of pituitary tumor with septoplasty and bilateral inferior turbinate reduction P:  - Per NSGY/ ENT - CSF precautions - serial neuro exams - SBP goal < 150, remains in goal  - strict I/O's, monitor UOP, trend renal function.   - check Na if UOP > in 2hr - prn labetalol - multimodal pain management w/ bowel regimen - complete post op abx/ ancef - cont hydrocortisone, synthroid - follow pathology  PCCM will follow while in ICU  Best Practice (right click and "Reselect all SmartList Selections" daily)   Diet/type: Regular consistency (see orders) DVT prophylaxis SCD Pressure ulcer(s): N/A GI prophylaxis: N/A Lines: Arterial Line Foley:  Yes, and it is still needed> d/c ? If ok NSGY Code Status:  full code Last date of multidisciplinary goals of care discussion [per primary]  Wife at bedside, updated 12/14  Labs   CBC: Recent Labs  Lab 02/22/23 0850  WBC 4.8  HGB 14.7  HCT 43.3  MCV 83.6  PLT 229  Basic Metabolic Panel: Recent Labs  Lab 02/22/23 0850 02/25/23 0614  NA 139 137  K 4.7 4.1  CL 107 106  CO2 25 22  GLUCOSE 94 121*  BUN 21 18  CREATININE 0.61 0.79  CALCIUM 9.8 9.1   GFR: Estimated Creatinine Clearance: 99.4 mL/min (by C-G formula based on SCr of 0.79 mg/dL). Recent Labs  Lab 02/22/23 0850  WBC 4.8    Liver Function Tests: No results  for input(s): "AST", "ALT", "ALKPHOS", "BILITOT", "PROT", "ALBUMIN" in the last 168 hours. No results for input(s): "LIPASE", "AMYLASE" in the last 168 hours. No results for input(s): "AMMONIA" in the last 168 hours.  ABG No results found for: "PHART", "PCO2ART", "PO2ART", "HCO3", "TCO2", "ACIDBASEDEF", "O2SAT"   Coagulation Profile: No results for input(s): "INR", "PROTIME" in the last 168 hours.  Cardiac Enzymes: No results for input(s): "CKTOTAL", "CKMB", "CKMBINDEX", "TROPONINI" in the last 168 hours.  HbA1C: No results found for: "HGBA1C"  CBG: No results for input(s): "GLUCAP" in the last 168 hours.   Critical care time: 30 mins       Posey Boyer, MSN, AG-ACNP-BC [facility] 02/25/2023, 8:13 AM  See Amion for pager If no response to pager , please call [phone] until 7pm After 7:00 pm call Elink  [phone]

## 2023-02-25 NOTE — Progress Notes (Signed)
Subjective: Patient reports mild frontal headache, no visual changes, no evidence CSF leak  Objective: Vital signs in last 24 hours: Temp:  [97.3 F (36.3 C)-98.4 F (36.9 C)] 98.2 F (36.8 C) (12/14 0800) Pulse Rate:  [75-93] 83 (12/14 0700) Resp:  [10-20] 15 (12/14 0700) BP: (95-136)/(71-87) 113/82 (12/14 0700) SpO2:  [91 %-100 %] 94 % (12/14 0700) Arterial Line BP: (116-171)/(54-72) 136/65 (12/14 0700)  Intake/Output from previous day: 12/13 0701 - 12/14 0700 In: 1790 [P.O.:490; I.V.:1100; IV Piggyback:200] Out: 2690 [Urine:2590; Blood:100] Intake/Output this shift: No intake/output data recorded.  Neurologic: Grossly normal  Lab Results: Lab Results  Component Value Date   WBC 4.8 02/22/2023   HGB 14.7 02/22/2023   HCT 43.3 02/22/2023   MCV 83.6 02/22/2023   PLT 229 02/22/2023   No results found for: "INR", "PROTIME" BMET Lab Results  Component Value Date   NA 137 02/25/2023   K 4.1 02/25/2023   CL 106 02/25/2023   CO2 22 02/25/2023   GLUCOSE 121 (H) 02/25/2023   BUN 18 02/25/2023   CREATININE 0.79 02/25/2023   CALCIUM 9.1 02/25/2023    Studies/Results: No results found.  Assessment/Plan: Doing well, no evidence DI, drinking well, Na 137. Mobilize today  Estimated body mass index is 27.2 kg/m as calculated from the following:   Height as of this encounter: 5\' 11"  (1.803 m).   Weight as of this encounter: 88.5 kg.    LOS: 1 day    Tia Alert 02/25/2023, 8:47 AM

## 2023-02-26 ENCOUNTER — Encounter (HOSPITAL_COMMUNITY): Payer: Self-pay | Admitting: Neurosurgery

## 2023-02-26 MED ORDER — HYDROCODONE-ACETAMINOPHEN 5-325 MG PO TABS: 1.0000 | ORAL_TABLET | ORAL | 0 refills | Status: AC | PRN

## 2023-02-26 NOTE — Discharge Summary (Signed)
Physician Discharge Summary  Patient ID: Edward Horton MRN: [id number] DOB/AGE: 1961-01-30 62 y.o.  Admit date: 02/24/2023 Discharge date: 02/26/2023  Admission Diagnoses:  Pituitary macroadenoma  Discharge Diagnoses: same   Discharged Condition: good  Hospital Course: The patient was admitted on 02/24/2023 and taken to the operating room where the patient underwent Endoscopic transnasal transsphenoidal resection of pituitary tumor . The patient tolerated the procedure well and was taken to the recovery room and then to the ICU in stable condition. The hospital course was routine. There were no complications. The wound remained clean dry and intact. Pt had appropriate head soreness. No complaints of new pain or new N/T/W. The patient remained afebrile with stable vital signs, and tolerated a regular diet. The patient continued to increase activities, and pain was well controlled with oral pain medications.   Consults: None  Significant Diagnostic Studies:  Results for orders placed or performed during the hospital encounter of 02/24/23  ABO/Rh   Collection Time: 02/24/23  7:00 AM  Result Value Ref Range   ABO/RH(D)      O POS Performed at [facility], [address] [place], [place] [address]   Basic metabolic panel   Collection Time: 02/25/23  6:14 AM  Result Value Ref Range   Sodium 137 135 - 145 mmol/L   Potassium 4.1 3.5 - 5.1 mmol/L   Chloride 106 98 - 111 mmol/L   CO2 22 22 - 32 mmol/L   Glucose, Bld 121 (H) 70 - 99 mg/dL   BUN 18 8 - 23 mg/dL   Creatinine, Ser 8.46 0.61 - 1.24 mg/dL   Calcium 9.1 8.9 - 96.2 mg/dL   GFR, Estimated >95 >28 mL/min   Anion gap 9 5 - 15    No results found.  Antibiotics:  Anti-infectives (From admission, onward)    Start     Dose/Rate Route Frequency Ordered Stop   02/24/23 1430  ceFAZolin (ANCEF) IVPB 2g/100 mL premix        2 g 200 mL/hr over 30 Minutes Intravenous Every 8 hours 02/24/23 1331 02/24/23 2216   02/24/23  0600  ceFAZolin (ANCEF) IVPB 2g/100 mL premix        2 g 200 mL/hr over 30 Minutes Intravenous On call to O.R. 02/24/23 0553 02/24/23 4132       Discharge Exam: Blood pressure 106/80, pulse 71, temperature 97.9 F (36.6 C), temperature source Oral, resp. rate 14, height 5\' 11"  (1.803 m), weight 88.5 kg, SpO2 96%. Neurologic: Grossly normal Ambulating and voiding well incision cdi   Discharge Medications:   Allergies as of 02/26/2023   No Known Allergies      Medication List     TAKE these medications    B-12 5000 MCG Tbdp Take 5,000 mcg by mouth daily.   celecoxib 200 MG capsule Commonly known as: CELEBREX Take 200 mg by mouth daily.   Fish Oil 1000 MG Caps Take 2,000 mg by mouth daily.   HYDROcodone-acetaminophen 5-325 MG tablet Commonly known as: NORCO/VICODIN Take 1 tablet by mouth every 4 (four) hours as needed for moderate pain (pain score 4-6).   hydrocortisone 10 MG tablet Commonly known as: CORTEF Take 10 mg by mouth daily.   levothyroxine 50 MCG tablet Commonly known as: SYNTHROID Take 50 mcg by mouth daily before breakfast.   multivitamin with minerals Tabs tablet Take 1 tablet by mouth daily. Centrum Multivitamin   Pfizer COVID-19 Vac Bivalent injection Generic drug: COVID-19 mRNA bivalent vaccine Proofreader) Inject into  the muscle.   rosuvastatin 10 MG tablet Commonly known as: CRESTOR Take 10 mg by mouth daily.   sodium chloride 5 % ophthalmic solution Commonly known as: MURO 128 Place 1 drop into both eyes 4 (four) times daily as needed for eye irritation.   TURMERIC CURCUMIN PO Take 2,000 mg by mouth daily.   Vitamin D 50 MCG (2000 UT) tablet Take 2,000 Units by mouth daily.   vitamin E 180 MG (400 UNITS) capsule Take 400 Units by mouth daily.        Disposition: home   Final Dx: pituitary macroadenoma  Discharge Instructions     Call MD for:  difficulty breathing, headache or visual disturbances   Complete by: As directed     Call MD for:  hives   Complete by: As directed    Call MD for:  persistant nausea and vomiting   Complete by: As directed    Call MD for:  redness, tenderness, or signs of infection (pain, swelling, redness, odor or green/yellow discharge around incision site)   Complete by: As directed    Call MD for:  severe uncontrolled pain   Complete by: As directed    Call MD for:  temperature >100.4   Complete by: As directed    Diet - low sodium heart healthy   Complete by: As directed    Driving Restrictions   Complete by: As directed    No driving for 2 weeks, no riding in the car for 1 week   Increase activity slowly   Complete by: As directed         Follow-up Information     Skotnicki, Meghan A, DO Follow up on 03/02/2023.   Specialty: Otolaryngology Why: Follow up as scheduled on 03/02/2023 @9  AM Contact information: [address] [place] [address] [place] [address] [phone]                  Signed: Tiana Loft [facility] 02/26/2023, 1:27 PM

## 2023-02-26 NOTE — Progress Notes (Signed)
Subjective: Patient reports doing well awake and alert minimal headache  Objective: Vital signs in last 24 hours: Temp:  [97.7 F (36.5 C)-98.2 F (36.8 C)] 97.8 F (36.6 C) (12/15 0800) Pulse Rate:  [67-85] 78 (12/15 0800) Resp:  [11-21] 11 (12/15 0800) BP: (103-136)/(68-90) 116/76 (12/15 0800) SpO2:  [94 %-97 %] 96 % (12/15 0800)  Intake/Output from previous day: 12/14 0701 - 12/15 0700 In: -  Out: 2000 [Urine:2000] Intake/Output this shift: No intake/output data recorded.  Awake alert oriented strength 5 out of 5 some serous drainage from his nose but minimal  Lab Results: No results for input(s): "WBC", "HGB", "HCT", "PLT" in the last 72 hours. BMET Recent Labs    02/25/23 0614  NA 137  K 4.1  CL 106  CO2 22  GLUCOSE 121*  BUN 18  CREATININE 0.79  CALCIUM 9.1    Studies/Results: No results found.  Assessment/Plan: Postop day 2 transphenoidal doing fairly well is not quite ready to go home today mobilize around the unit possible discharge later or tomorrow  LOS: 2 days     Mariam Dollar 02/26/2023, 10:02 AM

## 2023-02-26 NOTE — Progress Notes (Signed)
Physical Therapy Treatment Patient Details Name: Edward Horton MRN: [id number] DOB: Jul 28, 1960 Today's Date: 02/26/2023   History of Present Illness Pt is a 62 y.o. male s/p endoscopic transnasal transsphenoidal resection of pituitary tumor. PMH: OA, h/o L revers TSA, peyronie's disease, anxiety    PT Comments  Pt received in bed. Supervision bed mobility, CGA transfers, and CGA ambulation 400' without AD. No LOB noted. Pt with c/o mild headache, but unaffected by mobility and position change. Wife present and supportive. PT to continue to follow if pt remains in hospital. No follow up services or DME needs.     If plan is discharge home, recommend the following: Assist for transportation;A little help with bathing/dressing/bathroom;Help with stairs or ramp for entrance;Assistance with cooking/housework   Can travel by private vehicle        Equipment Recommendations  None recommended by PT    Recommendations for Other Services       Precautions / Restrictions Precautions Precautions: Fall     Mobility  Bed Mobility Overal bed mobility: Needs Assistance Bed Mobility: Supine to Sit     Supine to sit: Supervision, Used rails     General bed mobility comments: increased time    Transfers Overall transfer level: Needs assistance Equipment used: None Transfers: Sit to/from Stand Sit to Stand: Contact guard assist           General transfer comment: for safety    Ambulation/Gait Ambulation/Gait assistance: Contact guard assist Gait Distance (Feet): 400 Feet Assistive device: None Gait Pattern/deviations: Step-through pattern, Decreased stride length Gait velocity: decreased Gait velocity interpretation: 1.31 - 2.62 ft/sec, indicative of limited community ambulator   General Gait Details: steady gait. Initially requiring HHA but progressed to light CGA at gait belt   Stairs             Wheelchair Mobility     Tilt Bed    Modified Rankin (Stroke  Patients Only)       Balance Overall balance assessment: Mild deficits observed, not formally tested                                          Cognition Arousal: Alert Behavior During Therapy: WFL for tasks assessed/performed Overall Cognitive Status: Within Functional Limits for tasks assessed                                          Exercises      General Comments General comments (skin integrity, edema, etc.): VSS on RA      Pertinent Vitals/Pain Pain Assessment Pain Assessment: Faces Faces Pain Scale: Hurts a little bit Pain Location: frontal headache Pain Descriptors / Indicators: Headache Pain Intervention(s): Monitored during session, Repositioned    Home Living                          Prior Function            PT Goals (current goals can now be found in the care plan section) Acute Rehab PT Goals Patient Stated Goal: home Progress towards PT goals: Progressing toward goals    Frequency    Min 1X/week      PT Plan      Co-evaluation  AM-PAC PT "6 Clicks" Mobility   Outcome Measure  Help needed turning from your back to your side while in a flat bed without using bedrails?: None Help needed moving from lying on your back to sitting on the side of a flat bed without using bedrails?: A Little Help needed moving to and from a bed to a chair (including a wheelchair)?: A Little Help needed standing up from a chair using your arms (e.g., wheelchair or bedside chair)?: A Little Help needed to walk in hospital room?: A Little Help needed climbing 3-5 steps with a railing? : A Little 6 Click Score: 19    End of Session Equipment Utilized During Treatment: Gait belt Activity Tolerance: Patient tolerated treatment well Patient left: in chair;with call bell/phone within reach;with family/visitor present Nurse Communication: Mobility status PT Visit Diagnosis: Muscle weakness (generalized)  (M62.81);Difficulty in walking, not elsewhere classified (R26.2)     Time: 9485-4627 PT Time Calculation (min) (ACUTE ONLY): 23 min  Charges:    $Gait Training: 23-37 mins PT General Charges $$ ACUTE PT VISIT: 1 Visit                     Ferd Glassing., PT  Office # [phone]    Ilda Foil 02/26/2023, 1:54 PM

## 2023-02-27 LAB — SURGICAL PATHOLOGY

## 2023-03-02 DIAGNOSIS — J342 Deviated nasal septum: Secondary | ICD-10-CM | POA: Diagnosis not present

## 2023-03-02 DIAGNOSIS — J343 Hypertrophy of nasal turbinates: Secondary | ICD-10-CM | POA: Diagnosis not present

## 2023-03-02 DIAGNOSIS — D497 Neoplasm of unspecified behavior of endocrine glands and other parts of nervous system: Secondary | ICD-10-CM | POA: Diagnosis not present

## 2023-03-10 DIAGNOSIS — E038 Other specified hypothyroidism: Secondary | ICD-10-CM | POA: Diagnosis not present

## 2023-03-10 DIAGNOSIS — R6882 Decreased libido: Secondary | ICD-10-CM | POA: Diagnosis not present

## 2023-03-10 DIAGNOSIS — E230 Hypopituitarism: Secondary | ICD-10-CM | POA: Diagnosis not present

## 2023-03-10 DIAGNOSIS — E221 Hyperprolactinemia: Secondary | ICD-10-CM | POA: Diagnosis not present

## 2023-03-17 DIAGNOSIS — E2749 Other adrenocortical insufficiency: Secondary | ICD-10-CM | POA: Diagnosis not present

## 2023-03-17 DIAGNOSIS — E038 Other specified hypothyroidism: Secondary | ICD-10-CM | POA: Diagnosis not present

## 2023-03-17 DIAGNOSIS — E230 Hypopituitarism: Secondary | ICD-10-CM | POA: Diagnosis not present

## 2023-03-23 DIAGNOSIS — D352 Benign neoplasm of pituitary gland: Secondary | ICD-10-CM | POA: Diagnosis not present

## 2023-03-23 DIAGNOSIS — Z9884 Bariatric surgery status: Secondary | ICD-10-CM | POA: Diagnosis not present

## 2023-04-20 DIAGNOSIS — E230 Hypopituitarism: Secondary | ICD-10-CM | POA: Diagnosis not present

## 2023-04-20 DIAGNOSIS — D352 Benign neoplasm of pituitary gland: Secondary | ICD-10-CM | POA: Diagnosis not present

## 2023-04-20 DIAGNOSIS — R6882 Decreased libido: Secondary | ICD-10-CM | POA: Diagnosis not present

## 2023-04-20 DIAGNOSIS — E221 Hyperprolactinemia: Secondary | ICD-10-CM | POA: Diagnosis not present

## 2023-04-20 DIAGNOSIS — E038 Other specified hypothyroidism: Secondary | ICD-10-CM | POA: Diagnosis not present

## 2023-04-25 ENCOUNTER — Other Ambulatory Visit: Payer: Self-pay | Admitting: Family Medicine

## 2023-04-25 DIAGNOSIS — R918 Other nonspecific abnormal finding of lung field: Secondary | ICD-10-CM

## 2023-06-12 DIAGNOSIS — E2749 Other adrenocortical insufficiency: Secondary | ICD-10-CM | POA: Diagnosis not present

## 2023-06-12 DIAGNOSIS — E038 Other specified hypothyroidism: Secondary | ICD-10-CM | POA: Diagnosis not present

## 2023-06-12 DIAGNOSIS — E291 Testicular hypofunction: Secondary | ICD-10-CM | POA: Diagnosis not present

## 2023-07-10 DIAGNOSIS — Z9889 Other specified postprocedural states: Secondary | ICD-10-CM | POA: Diagnosis not present

## 2023-07-10 DIAGNOSIS — J309 Allergic rhinitis, unspecified: Secondary | ICD-10-CM | POA: Diagnosis not present

## 2023-07-10 DIAGNOSIS — D352 Benign neoplasm of pituitary gland: Secondary | ICD-10-CM | POA: Diagnosis not present

## 2023-07-12 DIAGNOSIS — Z6831 Body mass index (BMI) 31.0-31.9, adult: Secondary | ICD-10-CM | POA: Diagnosis not present

## 2023-07-12 DIAGNOSIS — D352 Benign neoplasm of pituitary gland: Secondary | ICD-10-CM | POA: Diagnosis not present

## 2023-08-08 DIAGNOSIS — E291 Testicular hypofunction: Secondary | ICD-10-CM | POA: Diagnosis not present

## 2023-08-08 DIAGNOSIS — E038 Other specified hypothyroidism: Secondary | ICD-10-CM | POA: Diagnosis not present

## 2023-08-08 DIAGNOSIS — R3911 Hesitancy of micturition: Secondary | ICD-10-CM | POA: Diagnosis not present

## 2023-08-08 DIAGNOSIS — E2749 Other adrenocortical insufficiency: Secondary | ICD-10-CM | POA: Diagnosis not present

## 2023-09-05 ENCOUNTER — Other Ambulatory Visit (HOSPITAL_COMMUNITY): Payer: Self-pay | Admitting: Neurosurgery

## 2023-09-05 DIAGNOSIS — D352 Benign neoplasm of pituitary gland: Secondary | ICD-10-CM

## 2023-09-09 ENCOUNTER — Ambulatory Visit (HOSPITAL_COMMUNITY)
Admission: RE | Admit: 2023-09-09 | Discharge: 2023-09-09 | Disposition: A | Discharge status: Home / Self Care | Source: Ambulatory Visit | Attending: Neurosurgery | Admitting: Neurosurgery

## 2023-09-09 DIAGNOSIS — Q048 Other specified congenital malformations of brain: Secondary | ICD-10-CM | POA: Diagnosis not present

## 2023-09-09 DIAGNOSIS — D352 Benign neoplasm of pituitary gland: Secondary | ICD-10-CM | POA: Diagnosis not present

## 2023-09-09 DIAGNOSIS — I6782 Cerebral ischemia: Secondary | ICD-10-CM | POA: Diagnosis not present

## 2023-09-09 MED ORDER — GADOBUTROL 1 MMOL/ML IV SOLN
9.0000 mL | Freq: Once | INTRAVENOUS | Status: AC | PRN
  Administered 2023-09-09: 9 mL via INTRAVENOUS

## 2023-09-25 DIAGNOSIS — D352 Benign neoplasm of pituitary gland: Secondary | ICD-10-CM | POA: Diagnosis not present

## 2023-10-05 DIAGNOSIS — E038 Other specified hypothyroidism: Secondary | ICD-10-CM | POA: Diagnosis not present

## 2023-11-01 ENCOUNTER — Other Ambulatory Visit: Payer: Self-pay | Admitting: Family Medicine

## 2023-11-01 DIAGNOSIS — R918 Other nonspecific abnormal finding of lung field: Secondary | ICD-10-CM

## 2023-11-15 ENCOUNTER — Ambulatory Visit
Admission: RE | Admit: 2023-11-15 | Discharge: 2023-11-15 | Disposition: A | Discharge status: Home / Self Care | Source: Ambulatory Visit | Attending: Family Medicine | Admitting: Family Medicine

## 2023-11-15 DIAGNOSIS — R918 Other nonspecific abnormal finding of lung field: Secondary | ICD-10-CM

## 2023-11-15 DIAGNOSIS — J439 Emphysema, unspecified: Secondary | ICD-10-CM | POA: Diagnosis not present

## 2023-11-15 DIAGNOSIS — J984 Other disorders of lung: Secondary | ICD-10-CM | POA: Diagnosis not present

## 2023-12-01 DIAGNOSIS — Z0000 Encounter for general adult medical examination without abnormal findings: Secondary | ICD-10-CM | POA: Diagnosis not present

## 2024-02-01 ENCOUNTER — Other Ambulatory Visit: Payer: Self-pay | Admitting: Ophthalmology

## 2024-02-01 DIAGNOSIS — H532 Diplopia: Secondary | ICD-10-CM | POA: Diagnosis not present

## 2024-02-01 DIAGNOSIS — H40013 Open angle with borderline findings, low risk, bilateral: Secondary | ICD-10-CM | POA: Diagnosis not present

## 2024-02-01 DIAGNOSIS — D443 Neoplasm of uncertain behavior of pituitary gland: Secondary | ICD-10-CM | POA: Diagnosis not present

## 2024-02-02 ENCOUNTER — Other Ambulatory Visit: Payer: Self-pay | Admitting: Ophthalmology

## 2024-02-02 DIAGNOSIS — H532 Diplopia: Secondary | ICD-10-CM

## 2024-02-05 DIAGNOSIS — E2749 Other adrenocortical insufficiency: Secondary | ICD-10-CM | POA: Diagnosis not present

## 2024-02-05 DIAGNOSIS — E038 Other specified hypothyroidism: Secondary | ICD-10-CM | POA: Diagnosis not present

## 2024-02-05 DIAGNOSIS — E291 Testicular hypofunction: Secondary | ICD-10-CM | POA: Diagnosis not present

## 2024-02-05 DIAGNOSIS — H532 Diplopia: Secondary | ICD-10-CM | POA: Diagnosis not present

## 2024-02-06 ENCOUNTER — Encounter: Payer: Self-pay | Admitting: Ophthalmology

## 2024-02-13 ENCOUNTER — Ambulatory Visit
Admission: RE | Admit: 2024-02-13 | Discharge: 2024-02-13 | Disposition: A | Source: Ambulatory Visit | Attending: Ophthalmology | Admitting: Ophthalmology

## 2024-02-13 DIAGNOSIS — H532 Diplopia: Secondary | ICD-10-CM | POA: Diagnosis not present
